# Patient Record
Sex: Female | Born: 1960 | Race: White | Hispanic: No | Marital: Married | Smoking: Never smoker
Health system: Southern US, Community
[De-identification: ages and names within clinical notes are randomized; demographics above are authoritative.]

## PROBLEM LIST (undated history)

## (undated) DIAGNOSIS — I10 Essential (primary) hypertension: Secondary | ICD-10-CM

## (undated) DIAGNOSIS — K219 Gastro-esophageal reflux disease without esophagitis: Secondary | ICD-10-CM

## (undated) DIAGNOSIS — F32A Depression, unspecified: Secondary | ICD-10-CM

## (undated) DIAGNOSIS — R51 Headache: Secondary | ICD-10-CM

## (undated) DIAGNOSIS — G8929 Other chronic pain: Secondary | ICD-10-CM

## (undated) DIAGNOSIS — F329 Major depressive disorder, single episode, unspecified: Secondary | ICD-10-CM

## (undated) DIAGNOSIS — Z90710 Acquired absence of both cervix and uterus: Secondary | ICD-10-CM

## (undated) HISTORY — DX: Headache, unspecified: G89.29

## (undated) HISTORY — DX: Gastro-esophageal reflux disease without esophagitis: K21.9

## (undated) HISTORY — DX: Headache: R51

## (undated) HISTORY — DX: Acquired absence of both cervix and uterus: Z90.710

## (undated) HISTORY — DX: Major depressive disorder, single episode, unspecified: F32.9

## (undated) HISTORY — DX: Depression, unspecified: F32.A

---

## 2010-03-07 HISTORY — PX: LASIK: SHX215

## 2013-07-18 ENCOUNTER — Emergency Department (HOSPITAL_COMMUNITY)
Admission: EM | Admit: 2013-07-18 | Discharge: 2013-07-18 | Disposition: A | Discharge status: Home / Self Care | Payer: BC Managed Care – PPO | Attending: Emergency Medicine | Admitting: Emergency Medicine

## 2013-07-18 ENCOUNTER — Encounter (HOSPITAL_COMMUNITY): Payer: Self-pay | Admitting: Emergency Medicine

## 2013-07-18 ENCOUNTER — Emergency Department (HOSPITAL_COMMUNITY): Payer: BC Managed Care – PPO

## 2013-07-18 DIAGNOSIS — Z79899 Other long term (current) drug therapy: Secondary | ICD-10-CM | POA: Insufficient documentation

## 2013-07-18 DIAGNOSIS — R079 Chest pain, unspecified: Secondary | ICD-10-CM

## 2013-07-18 DIAGNOSIS — I10 Essential (primary) hypertension: Secondary | ICD-10-CM | POA: Insufficient documentation

## 2013-07-18 DIAGNOSIS — R002 Palpitations: Secondary | ICD-10-CM

## 2013-07-18 DIAGNOSIS — R0602 Shortness of breath: Secondary | ICD-10-CM | POA: Insufficient documentation

## 2013-07-18 DIAGNOSIS — R072 Precordial pain: Secondary | ICD-10-CM | POA: Insufficient documentation

## 2013-07-18 DIAGNOSIS — Z7982 Long term (current) use of aspirin: Secondary | ICD-10-CM | POA: Insufficient documentation

## 2013-07-18 HISTORY — DX: Essential (primary) hypertension: I10

## 2013-07-18 LAB — I-STAT TROPONIN, ED
TROPONIN I, POC: 0 ng/mL (ref 0.00–0.08)
Troponin i, poc: 0 ng/mL (ref 0.00–0.08)

## 2013-07-18 LAB — BASIC METABOLIC PANEL WITH GFR
BUN: 13 mg/dL (ref 6–23)
CO2: 24 meq/L (ref 19–32)
Calcium: 9.8 mg/dL (ref 8.4–10.5)
Chloride: 104 meq/L (ref 96–112)
Creatinine, Ser: 0.65 mg/dL (ref 0.50–1.10)
GFR calc Af Amer: >90 mL/min
GFR calc non Af Amer: >90 mL/min
Glucose, Bld: 138 mg/dL — ABNORMAL HIGH (ref 70–99)
Potassium: 3.9 meq/L (ref 3.7–5.3)
Sodium: 141 meq/L (ref 137–147)

## 2013-07-18 LAB — CBC
HCT: 42.7 % (ref 36.0–46.0)
Hemoglobin: 14.8 g/dL (ref 12.0–15.0)
MCH: 26.9 pg (ref 26.0–34.0)
MCHC: 34.7 g/dL (ref 30.0–36.0)
MCV: 77.6 fL — ABNORMAL LOW (ref 78.0–100.0)
Platelets: 253 K/uL (ref 150–400)
RBC: 5.5 MIL/uL — ABNORMAL HIGH (ref 3.87–5.11)
RDW: 16 % — ABNORMAL HIGH (ref 11.5–15.5)
WBC: 4.4 K/uL (ref 4.0–10.5)

## 2013-07-18 LAB — PRO B NATRIURETIC PEPTIDE: Pro B Natriuretic peptide (BNP): 20.9 pg/mL (ref 0–125)

## 2013-07-18 MED ORDER — ASPIRIN 325 MG PO TABS
325.0000 mg | ORAL_TABLET | ORAL | Status: AC
  Administered 2013-07-18: 325 mg via ORAL
  Filled 2013-07-18: qty 1

## 2013-07-18 NOTE — ED Notes (Signed)
Patient transported to X-ray 

## 2013-07-18 NOTE — Discharge Instructions (Signed)
Read instructions below for reasons to return to the Emergency Department. It is recommended that your follow up with your Primary Care Doctor in regards to today's visit. If you do not have a doctor, use the resource guide listed below to help you find one.   Chest Pain (Nonspecific)  HOME CARE INSTRUCTIONS  For the next few days, avoid physical activities that bring on chest pain. Continue physical activities as directed.  Do not smoke cigarettes or drink alcohol until your symptoms are gone.  Only take over-the-counter or prescription medicine for pain, discomfort, or fever as directed by your caregiver.  Follow your caregiver's suggestions for further testing if your chest pain does not go away.  Keep any follow-up appointments you made. If you do not go to an appointment, you could develop lasting (chronic) problems with pain. If there is any problem keeping an appointment, you must call to reschedule.  SEEK MEDICAL CARE IF:  You think you are having problems from the medicine you are taking. Read your medicine instructions carefully.  Your chest pain does not go away, even after treatment.  You develop a rash with blisters on your chest.  SEEK IMMEDIATE MEDICAL CARE IF:  You have increased chest pain or pain that spreads to your arm, neck, jaw, back, or belly (abdomen).  You develop shortness of breath, an increasing cough, or you are coughing up blood.  You have severe back or abdominal pain, feel sick to your stomach (nauseous) or throw up (vomit).  You develop severe weakness, fainting, or chills.  You have an oral temperature above 102 F (38.9 C), not controlled by medicine.   THIS IS AN EMERGENCY. Do not wait to see if the pain will go away. Get medical help at once. Call your local emergency services (911 in [place] Do not drive yourself to the hospital.   RESOURCE GUIDE  Dental Problems  Patients with Medicaid: [facility] [phone] [address] Phone:  [phone]                                                  Phone:  [phone]  If unable to pay or uninsured, contact:  [facility] or [facility]. to become qualified for the adult dental clinic.  Chronic Pain Problems Contact [facility]  [phone] Patients need to be referred by their primary care doctor.  Insufficient Money for Medicine Contact [employer] Way:  call "211" or [place] [phone].  No Primary Care Doctor Call [facility]  [phone] Other agencies that provide inexpensive medical care    [place]  [phone]    [facility]  [place]  [phone]    [facility]  [phone]    [facility]  [place]  [place]  [place]  [place]   [phone] (emergency services [phone])  Substance Abuse Resources Alcohol and Drug Services  [place] [phone] [facility] [place] [phone] Residential & Outpatient Substance Abuse Program  [phone]  Abuse/Neglect [place] [phone] [place] [phone] (After Hours)  Emergency [place] [phone]  [place] [facility] [place] [phone] [place] [phone]  MRSA Hotline #:   (361)714-2010    [facility] Dept. [address]      [place] Phone:  [phone]                                   Phone:  [phone]                 Phone:  [place] Phone:  [place] [phone] (After Hours)

## 2013-07-18 NOTE — ED Provider Notes (Signed)
CSN: [id number]     Arrival date & time 07/18/13  1144 History   First MD Initiated Contact with Patient 07/18/13 1159     Chief Complaint  Patient presents with  . Chest Pain  . Palpitations     (Consider location/radiation/quality/duration/timing/severity/associated sxs/prior Treatment) HPI Comments: Patient presents today with a chief complaint of chest pain, palpitations, and SOB.  She reports that she has been having palpitations intermittently over the past 1.5 weeks.  Palpitations typically last 10-15 minutes and then resolve.  Palpitations have been occuring once a day every other day.  She states that this morning approximately two hours prior to arrival in the ED she began having chest pain.  Pain substernal and did not radiate.  She describes the pain as an ache.  She was sitting and not doing anything exertional at the onset of chest pain.  Pain then became associated with numbness of her left arm en route to the ED.  She reports that she was also feeling mildly short of breath.  She states that all of her symptoms have improved at this time.  She denies any Cardiac history.  She has a history of HTN and is currently on Toprol for that.  Last dose of Toprol was this morning.  She denies Hyperlipidemia or DM.  Denies any family history of cardiac disease.  Denies any history of PE or DVT.  Denies LE edema.  Denies use of estrogen containing medications.  Denies smoking.  Denies prolonged travel or surgeries in the past 4 weeks.  The history is provided by the patient.    Past Medical History  Diagnosis Date  . Hypertension    History reviewed. No pertinent past surgical history. No family history on file. History  Substance Use Topics  . Smoking status: Never Smoker   . Smokeless tobacco: Not on file  . Alcohol Use: No   OB History   Grav Para Term Preterm Abortions TAB SAB Ect Mult Living                 Review of Systems  Respiratory: Positive for shortness of breath.    Cardiovascular: Positive for chest pain and palpitations.  Neurological: Positive for numbness.  All other systems reviewed and are negative.     Allergies  Review of patient's allergies indicates no known allergies.  Home Medications   Prior to Admission medications   Medication Sig Start Date End Date Taking? Authorizing Provider  aspirin-acetaminophen-caffeine (EXCEDRIN MIGRAINE) [phone] MG per tablet Take 1 tablet by mouth daily as needed for headache or migraine.   Yes Historical Provider, MD  escitalopram (LEXAPRO) 10 MG tablet Take 10 mg by mouth daily.   Yes Historical Provider, MD  metoprolol succinate (TOPROL-XL) 25 MG 24 hr tablet Take 25 mg by mouth 2 (two) times daily.   Yes Historical Provider, MD  Multiple Vitamins-Minerals (MULTIVITAMIN PO) Take 1 tablet by mouth daily.   Yes Historical Provider, MD  omeprazole (PRILOSEC) 40 MG capsule Take 40 mg by mouth 2 (two) times daily.   Yes Historical Provider, MD   BP 159/96  Pulse 95  Temp(Src) 97.9 F (36.6 C) (Oral)  Resp 18  SpO2 97% Physical Exam  Nursing note and vitals reviewed. Constitutional: She appears well-developed and well-nourished.  HENT:  Head: Normocephalic and atraumatic.  Mouth/Throat: Oropharynx is clear and moist.  Neck: Normal range of motion. Neck supple.  Cardiovascular: Normal rate, regular rhythm, normal heart sounds and intact distal pulses.  Pulmonary/Chest: Effort normal and breath sounds normal.  Abdominal: Soft. There is no tenderness.  Musculoskeletal: Normal range of motion.  No LE edema bilaterally   Neurological: She is alert.  Skin: Skin is warm and dry.  Psychiatric: She has a normal mood and affect.    ED Course  Procedures (including critical care time) Labs Review Labs Reviewed  CBC - Abnormal; Notable for the following:    RBC 5.50 (*)    MCV 77.6 (*)    RDW 16.0 (*)    All other components within normal limits  BASIC METABOLIC PANEL  PRO B NATRIURETIC  PEPTIDE  I-STAT TROPOININ, ED    Imaging Review Dg Chest 2 View  07/18/2013   CLINICAL DATA:  Sacral ala or history of left midchest discomfort and left upper extremity discomfort.  EXAM: CHEST  2 VIEW  COMPARISON:  None.  FINDINGS: The lungs are adequately inflated. There is no focal infiltrate. There is minimal linear density approximately 3 cm above the left lateral costophrenic gutter on the frontal film which likely reflects scarring. The cardiopericardial silhouette is normal in size. The pulmonary vascularity is not engorged. The mediastinum is normal in width. There is no pleural effusion or pneumothorax or pneumomediastinum. The observed portions of the bony thorax exhibit no acute abnormalities. There is mild degenerative disc space narrowing at multiple levels.  IMPRESSION: There is no evidence of active cardiopulmonary disease.   Electronically Signed   By: David  Martinique   On: 07/18/2013 13:48     EKG Interpretation None      Date: 07/18/2013  Rate: 106  Rhythm: sinus tachycardia  QRS Axis: normal  Intervals: normal  ST/T Wave abnormalities: normal  Conduction Disutrbances:none  Narrative Interpretation:   Old EKG Reviewed: none available    Patient discussed with Dr. Jeneen Rinks. MDM   Final diagnoses:  None   Patient is to be discharged with recommendation to follow up with PCP in regards to today's hospital visit. Patient also given referral to Cardiology.  Chest pain is not likely of cardiac or pulmonary etiology d/t presentation, VSS, no tracheal deviation, no JVD or new murmur, Heart RRR, breath sounds equal bilaterally, EKG without acute abnormalities, negative initial and 3 hour troponin, and negative CXR.  Patient with atypical pain and a heart score of 2.  Patient stable for discharge.  Strict return precautions given.  Case has been discussed with  Dr. Jeneen Rinks who agrees with the above plan to discharge.        Hyman Bible, PA-C 07/18/13 [phone]

## 2013-07-18 NOTE — ED Notes (Signed)
Pt reports intermittent palpitations x 2 weeks, today pt was having some palpitations when she had a sudden onset of CP and numbness in left arm starting at elbow and going down, along with sob and feeling like her heart is fluttering. Pt is non-diaphoretic. VSS. Alert and oriented X 4. Only cardiac hx is HTN. Pt just moved her from [place] and doesn't have a PCP in town yet.

## 2013-07-25 NOTE — ED Provider Notes (Addendum)
Medical screening examination/treatment/procedure(s) were performed by non-physician practitioner and as supervising physician I was immediately available for consultation/collaboration.   EKG Interpretation   Date/Time:  Thursday Jul 18 2013 11:45:20 EDT Ventricular Rate:  106 PR Interval:  162 QRS Duration: 84 QT Interval:  340 QTC Calculation: 451 R Axis:   63 Text Interpretation:  Sinus tachycardia Otherwise normal ECG ED PHYSICIAN  INTERPRETATION AVAILABLE IN [facility] Confirmed by TEST, Record  ([id number]) on 07/20/2013 10:02:38 AM         EKG Interpretation   Date/Time:  Thursday Jul 18 2013 11:45:20 EDT Ventricular Rate:  106 PR Interval:  162 QRS Duration: 84 QT Interval:  340 QTC Calculation: 451 R Axis:   63 Text Interpretation:  Sinus tachycardia Otherwise normal ECG ED PHYSICIAN  INTERPRETATION AVAILABLE IN [facility] Confirmed by TEST, Record  ([id number]) on 07/20/2013 10:02:38 AM        Tanna Furry, MD 07/25/13 7824  Tanna Furry, MD 08/01/13 1601

## 2015-09-07 DIAGNOSIS — Z1211 Encounter for screening for malignant neoplasm of colon: Secondary | ICD-10-CM | POA: Diagnosis not present

## 2015-09-07 DIAGNOSIS — K219 Gastro-esophageal reflux disease without esophagitis: Secondary | ICD-10-CM | POA: Diagnosis not present

## 2015-09-07 DIAGNOSIS — Z01818 Encounter for other preprocedural examination: Secondary | ICD-10-CM | POA: Diagnosis not present

## 2015-10-23 DIAGNOSIS — Z1211 Encounter for screening for malignant neoplasm of colon: Secondary | ICD-10-CM | POA: Diagnosis not present

## 2015-10-23 DIAGNOSIS — K449 Diaphragmatic hernia without obstruction or gangrene: Secondary | ICD-10-CM | POA: Diagnosis not present

## 2015-12-09 DIAGNOSIS — K219 Gastro-esophageal reflux disease without esophagitis: Secondary | ICD-10-CM | POA: Diagnosis not present

## 2016-04-20 ENCOUNTER — Other Ambulatory Visit: Payer: Self-pay | Admitting: Physician Assistant

## 2016-04-20 ENCOUNTER — Ambulatory Visit
Admission: RE | Admit: 2016-04-20 | Discharge: 2016-04-20 | Disposition: A | Discharge status: Home / Self Care | Payer: BLUE CROSS/BLUE SHIELD | Source: Ambulatory Visit | Attending: Physician Assistant | Admitting: Physician Assistant

## 2016-04-20 DIAGNOSIS — R05 Cough: Secondary | ICD-10-CM | POA: Diagnosis not present

## 2016-04-20 DIAGNOSIS — K219 Gastro-esophageal reflux disease without esophagitis: Secondary | ICD-10-CM | POA: Diagnosis not present

## 2016-04-20 DIAGNOSIS — R0602 Shortness of breath: Secondary | ICD-10-CM | POA: Diagnosis not present

## 2016-04-20 DIAGNOSIS — R079 Chest pain, unspecified: Secondary | ICD-10-CM | POA: Diagnosis not present

## 2016-05-04 DIAGNOSIS — Z124 Encounter for screening for malignant neoplasm of cervix: Secondary | ICD-10-CM | POA: Diagnosis not present

## 2016-05-04 DIAGNOSIS — Z1151 Encounter for screening for human papillomavirus (HPV): Secondary | ICD-10-CM | POA: Diagnosis not present

## 2016-05-04 DIAGNOSIS — Z1231 Encounter for screening mammogram for malignant neoplasm of breast: Secondary | ICD-10-CM | POA: Diagnosis not present

## 2016-05-04 DIAGNOSIS — Z130 Encounter for screening for diseases of the blood and blood-forming organs and certain disorders involving the immune mechanism: Secondary | ICD-10-CM | POA: Diagnosis not present

## 2016-05-04 DIAGNOSIS — Z01419 Encounter for gynecological examination (general) (routine) without abnormal findings: Secondary | ICD-10-CM | POA: Diagnosis not present

## 2016-05-04 DIAGNOSIS — Z1389 Encounter for screening for other disorder: Secondary | ICD-10-CM | POA: Diagnosis not present

## 2016-05-04 DIAGNOSIS — Z6824 Body mass index (BMI) 24.0-24.9, adult: Secondary | ICD-10-CM | POA: Diagnosis not present

## 2016-05-05 DIAGNOSIS — Z124 Encounter for screening for malignant neoplasm of cervix: Secondary | ICD-10-CM | POA: Diagnosis not present

## 2016-05-13 ENCOUNTER — Other Ambulatory Visit: Payer: Self-pay | Admitting: Obstetrics and Gynecology

## 2016-05-13 DIAGNOSIS — N6320 Unspecified lump in the left breast, unspecified quadrant: Secondary | ICD-10-CM

## 2016-05-18 ENCOUNTER — Ambulatory Visit
Admission: RE | Admit: 2016-05-18 | Discharge: 2016-05-18 | Disposition: A | Discharge status: Home / Self Care | Payer: BLUE CROSS/BLUE SHIELD | Source: Ambulatory Visit | Attending: Obstetrics and Gynecology | Admitting: Obstetrics and Gynecology

## 2016-05-18 ENCOUNTER — Other Ambulatory Visit: Payer: BLUE CROSS/BLUE SHIELD

## 2016-05-18 DIAGNOSIS — R928 Other abnormal and inconclusive findings on diagnostic imaging of breast: Secondary | ICD-10-CM | POA: Diagnosis not present

## 2016-05-18 DIAGNOSIS — K219 Gastro-esophageal reflux disease without esophagitis: Secondary | ICD-10-CM | POA: Diagnosis not present

## 2016-05-18 DIAGNOSIS — N6489 Other specified disorders of breast: Secondary | ICD-10-CM | POA: Diagnosis not present

## 2016-05-18 DIAGNOSIS — N6320 Unspecified lump in the left breast, unspecified quadrant: Secondary | ICD-10-CM

## 2016-05-18 LAB — HM MAMMOGRAPHY: HM MAMMO: ABNORMAL — AB (ref 0–4)

## 2016-08-29 DIAGNOSIS — D179 Benign lipomatous neoplasm, unspecified: Secondary | ICD-10-CM | POA: Diagnosis not present

## 2016-08-29 DIAGNOSIS — E7800 Pure hypercholesterolemia, unspecified: Secondary | ICD-10-CM | POA: Diagnosis not present

## 2016-08-29 DIAGNOSIS — R7301 Impaired fasting glucose: Secondary | ICD-10-CM | POA: Diagnosis not present

## 2016-08-29 DIAGNOSIS — K219 Gastro-esophageal reflux disease without esophagitis: Secondary | ICD-10-CM | POA: Diagnosis not present

## 2016-08-29 DIAGNOSIS — I10 Essential (primary) hypertension: Secondary | ICD-10-CM | POA: Diagnosis not present

## 2016-08-29 LAB — BASIC METABOLIC PANEL
BUN: 19 (ref 4–21)
Creatinine: 0.8 (ref 0.5–1.1)
Glucose: 100

## 2016-08-29 LAB — CBC AND DIFFERENTIAL: WBC: 5.7

## 2016-12-26 ENCOUNTER — Ambulatory Visit: Payer: BLUE CROSS/BLUE SHIELD | Admitting: Family Medicine

## 2016-12-28 ENCOUNTER — Encounter: Payer: Self-pay | Admitting: Family Medicine

## 2016-12-28 ENCOUNTER — Ambulatory Visit (INDEPENDENT_AMBULATORY_CARE_PROVIDER_SITE_OTHER): Payer: BLUE CROSS/BLUE SHIELD | Admitting: Family Medicine

## 2016-12-28 VITALS — BP 150/100 | HR 82 | Ht 59.5 in | Wt 129.0 lb

## 2016-12-28 DIAGNOSIS — Z1159 Encounter for screening for other viral diseases: Secondary | ICD-10-CM | POA: Diagnosis not present

## 2016-12-28 DIAGNOSIS — I10 Essential (primary) hypertension: Secondary | ICD-10-CM | POA: Diagnosis not present

## 2016-12-28 DIAGNOSIS — Z13220 Encounter for screening for lipoid disorders: Secondary | ICD-10-CM | POA: Diagnosis not present

## 2016-12-28 DIAGNOSIS — F325 Major depressive disorder, single episode, in full remission: Secondary | ICD-10-CM

## 2016-12-28 DIAGNOSIS — R739 Hyperglycemia, unspecified: Secondary | ICD-10-CM | POA: Diagnosis not present

## 2016-12-28 LAB — COMPREHENSIVE METABOLIC PANEL
ALBUMIN: 4.3 g/dL (ref 3.5–5.2)
ALT: 37 U/L — ABNORMAL HIGH (ref 0–35)
AST: 23 U/L (ref 0–37)
Alkaline Phosphatase: 59 U/L (ref 39–117)
BILIRUBIN TOTAL: 0.6 mg/dL (ref 0.2–1.2)
BUN: 17 mg/dL (ref 6–23)
CO2: 27 meq/L (ref 19–32)
Calcium: 9.7 mg/dL (ref 8.4–10.5)
Chloride: 105 mEq/L (ref 96–112)
Creatinine, Ser: 0.77 mg/dL (ref 0.40–1.20)
GFR: 82.04 mL/min (ref >60.00)
Glucose, Bld: 114 mg/dL — ABNORMAL HIGH (ref 70–99)
Potassium: 4.1 mEq/L (ref 3.5–5.1)
Sodium: 140 mEq/L (ref 135–145)
Total Protein: 7.1 g/dL (ref 6.0–8.3)

## 2016-12-28 LAB — CBC
HCT: 47.5 % — ABNORMAL HIGH (ref 36.0–46.0)
Hemoglobin: 15.9 g/dL — ABNORMAL HIGH (ref 12.0–15.0)
MCHC: 33.5 g/dL (ref 30.0–36.0)
MCV: 84.3 fl (ref 78.0–100.0)
PLATELETS: 269 10*3/uL (ref 150.0–400.0)
RBC: 5.63 Mil/uL — ABNORMAL HIGH (ref 3.87–5.11)
RDW: 14 % (ref 11.5–15.5)
WBC: 5.1 10*3/uL (ref 4.0–10.5)

## 2016-12-28 LAB — LIPID PANEL
Cholesterol: 238 mg/dL — ABNORMAL HIGH (ref 0–200)
HDL: 50.6 mg/dL (ref >39.00)
NonHDL: 186.96
TRIGLYCERIDES: 287 mg/dL — AB (ref 0.0–149.0)
Total CHOL/HDL Ratio: 5
VLDL: 57.4 mg/dL — ABNORMAL HIGH (ref 0.0–40.0)

## 2016-12-28 LAB — TSH: TSH: 1.06 u[IU]/mL (ref 0.35–4.50)

## 2016-12-28 LAB — HEMOGLOBIN A1C: HEMOGLOBIN A1C: 6.1 % (ref 4.6–6.5)

## 2016-12-28 LAB — LDL CHOLESTEROL, DIRECT: LDL DIRECT: 160 mg/dL

## 2016-12-28 NOTE — Progress Notes (Signed)
Subjective:  Debbie Hill is a 56 y.o. female who presents today with a chief complaint of HTN and to establish care.   HPI:  Hypertension, Chronic Problem, new to this provider BP Readings from Last 3 Encounters:  12/28/16 (!) 150/100  07/18/13 145/96  Home BP monitoring: None Current Medications: Metoprolol 25 mg twice daily, compliant without side effects. Additional history: Long-standing history per patient.  Has been on metoprolol for several years.  She has also been on another blood pressure agent in the past but she does not remember the name.  Does not regularly exercise.  ROS: Denies any chest pain, shortness of breath, dyspnea on exertion, leg edema.   Depression/Anxiety, Chronic, new to this provider Current Medications: Lexapro 10 mg daily Side Effects: None Current Symptoms/Interim History: Long-standing history.  Has been stable on Lexapro for several years.  Depression screen [facility] 2/9 12/28/2016  Decreased Interest 0  Down, Depressed, Hopeless 0  PHQ - 2 Score 0   ROS: No SI or HI.  Clumsiness Patient is also concerned that she has been running into doors and walls.  No weakness, numbness, or other neurological symptoms.  No vision changes.  ROS: Per HPI, otherwise a 14 point review of systems was performed and was negative  PMH:  The following were reviewed and entered/updated in epic: Past Medical History:  Diagnosis Date  . Chronic headaches   . Depression   . GERD (gastroesophageal reflux disease)   . Hypertension    Patient Active Problem List   Diagnosis Date Noted  . Hypertension 12/28/2016  . Depression, major, single episode, complete remission ([place]) 12/28/2016   History reviewed. No pertinent surgical history.  Family History  Problem Relation Age of Onset  . Cancer Mother        Lung Cancer  . Hypertension Mother   . Cancer Father        Lung Cancer  . Hypertension Sister   . Depression Sister   . Hypertension Paternal  Grandmother   . Cancer Paternal Grandfather        Esophagus Cancer    Medications- reviewed and updated Current Outpatient Prescriptions  Medication Sig Dispense Refill  . aspirin-acetaminophen-caffeine (EXCEDRIN MIGRAINE) 250-250-65 MG per tablet Take 1 tablet by mouth daily as needed for headache or migraine.    Marland Kitchen escitalopram (LEXAPRO) 10 MG tablet Take 10 mg by mouth daily.    . metoprolol succinate (TOPROL-XL) 25 MG 24 hr tablet Take 25 mg by mouth 2 (two) times daily.    . Multiple Vitamins-Minerals (MULTIVITAMIN PO) Take 1 tablet by mouth daily.    . pantoprazole (PROTONIX) 40 MG tablet pantoprazole 40 mg tablet,delayed release    . ranitidine (ZANTAC) 300 MG tablet ranitidine 300 mg tablet     No current facility-administered medications for this visit.     Allergies-reviewed and updated No Known Allergies  Social History   Social History  . Marital status: Married    Spouse name: N/A  . Number of children: 2  . Years of education: N/A   Social History Main Topics  . Smoking status: Never Smoker  . Smokeless tobacco: Never Used  . Alcohol use No  . Drug use: No  . Sexual activity: Yes    Partners: Male   Other Topics Concern  . None   Social History Narrative  . None   Objective:  Physical Exam: BP (!) 150/100 (BP Location: Right Arm, Cuff Size: Normal)   Pulse 82  Ht 4' 11.5" (1.511 m)   Wt 129 lb (58.5 kg)   BMI 25.62 kg/m   Gen: NAD, resting comfortably HEENT: Extraocular eye movements intact.  Pupils equally round and reactive to light. CV: RRR with no murmurs appreciated Pulm: NWOB, CTAB with no crackles, wheezes, or rhonchi GI: Normal bowel sounds present. Soft, Nontender, Nondistended. MSK: No edema, cyanosis, or clubbing noted Skin: Warm, dry Neuro: Cranial nerves II through XII intact.  Finger-nose-finger testing intact bilaterally.  Strength 5 out of 5 in upper and lower extremities.  Sensation to light touch intact throughout.  Reflexes  2+ and symmetric bilaterally in upper and lower extremities. Psych: Normal affect and thought content  Assessment/Plan:  Hypertension Slightly above goal today.  Discussed adding on additional antihypertensive, however patient deferred.  Advised regular exercise and a healthy, low-salt diet.  Patient will follow-up in 3-4 weeks for blood pressure check.  If persistently elevated above 140/90, would start additional agent.  Depression, major, single episode, complete remission (HCC) PHQ score of 0 today.  Continue Lexapro 10 mg daily.  Preventative healthcare Flu shot deferred today.  Check lipid panel.  Check hepatitis C antibody.  Up-to-date on colonoscopy, Pap, mammogram- will obtain these records.  Hyperglycemia Check A1c today.  Algis Greenhouse. [name], MD 12/28/2016 9:53 AM

## 2016-12-28 NOTE — Assessment & Plan Note (Signed)
Slightly above goal today.  Discussed adding on additional antihypertensive, however patient deferred.  Advised regular exercise and a healthy, low-salt diet.  Patient will follow-up in 3-4 weeks for blood pressure check.  If persistently elevated above 140/90, would start additional agent.

## 2016-12-28 NOTE — Assessment & Plan Note (Signed)
PHQ score of 0 today.  Continue Lexapro 10 mg daily.

## 2016-12-28 NOTE — Patient Instructions (Addendum)
We will check blood work today.  Continue working on diet and exercise.  Come back for a blood pressure check in 3-4 weeks.  Your goal blood pressure is 140/90 or lower.  Take care, Dr. [name]   Preventive Care 40-64 Years, Female Preventive care refers to lifestyle choices and visits with your health care provider that can promote health and wellness. What does preventive care include?  A yearly physical exam. This is also called an annual well check.  Dental exams once or twice a year.  Routine eye exams. Ask your health care provider how often you should have your eyes checked.  Personal lifestyle choices, including: ? Daily care of your teeth and gums. ? Regular physical activity. ? Eating a healthy diet. ? Avoiding tobacco and drug use. ? Limiting alcohol use. ? Practicing safe sex. ? Taking low-dose aspirin daily starting at age 62. ? Taking vitamin and mineral supplements as recommended by your health care provider. What happens during an annual well check? The services and screenings done by your health care provider during your annual well check will depend on your age, overall health, lifestyle risk factors, and family history of disease. Counseling Your health care provider may ask you questions about your:  Alcohol use.  Tobacco use.  Drug use.  Emotional well-being.  Home and relationship well-being.  Sexual activity.  Eating habits.  Work and work Astronomer.  Method of birth control.  Menstrual cycle.  Pregnancy history.  Screening You may have the following tests or measurements:  Height, weight, and BMI.  Blood pressure.  Lipid and cholesterol levels. These may be checked every 5 years, or more frequently if you are over 63 years old.  Skin check.  Lung cancer screening. You may have this screening every year starting at age 36 if you have a 30-pack-year history of smoking and currently smoke or have quit within the past 15  years.  Fecal occult blood test (FOBT) of the stool. You may have this test every year starting at age 67.  Flexible sigmoidoscopy or colonoscopy. You may have a sigmoidoscopy every 5 years or a colonoscopy every 10 years starting at age 23.  Hepatitis C blood test.  Hepatitis B blood test.  Sexually transmitted disease (STD) testing.  Diabetes screening. This is done by checking your blood sugar (glucose) after you have not eaten for a while (fasting). You may have this done every 1-3 years.  Mammogram. This may be done every 1-2 years. Talk to your health care provider about when you should start having regular mammograms. This may depend on whether you have a family history of breast cancer.  BRCA-related cancer screening. This may be done if you have a family history of breast, ovarian, tubal, or peritoneal cancers.  Pelvic exam and Pap test. This may be done every 3 years starting at age 20. Starting at age 34, this may be done every 5 years if you have a Pap test in combination with an HPV test.  Bone density scan. This is done to screen for osteoporosis. You may have this scan if you are at high risk for osteoporosis.  Discuss your test results, treatment options, and if necessary, the need for more tests with your health care provider. Vaccines Your health care provider may recommend certain vaccines, such as:  Influenza vaccine. This is recommended every year.  Tetanus, diphtheria, and acellular pertussis (Tdap, Td) vaccine. You may need a Td booster every 10 years.  Varicella vaccine. You  may need this if you have not been vaccinated.  Zoster vaccine. You may need this after age 42.  Measles, mumps, and rubella (MMR) vaccine. You may need at least one dose of MMR if you were born in 1957 or later. You may also need a second dose.  Pneumococcal 13-valent conjugate (PCV13) vaccine. You may need this if you have certain conditions and were not previously  vaccinated.  Pneumococcal polysaccharide (PPSV23) vaccine. You may need one or two doses if you smoke cigarettes or if you have certain conditions.  Meningococcal vaccine. You may need this if you have certain conditions.  Hepatitis A vaccine. You may need this if you have certain conditions or if you travel or work in places where you may be exposed to hepatitis A.  Hepatitis B vaccine. You may need this if you have certain conditions or if you travel or work in places where you may be exposed to hepatitis B.  Haemophilus influenzae type b (Hib) vaccine. You may need this if you have certain conditions.  Talk to your health care provider about which screenings and vaccines you need and how often you need them. This information is not intended to replace advice given to you by your health care provider. Make sure you discuss any questions you have with your health care provider. Document Released: 03/20/2015 Document Revised: 11/11/2015 Document Reviewed: 12/23/2014 Elsevier Interactive Patient Education  2017 [employer].

## 2016-12-29 LAB — HEPATITIS C ANTIBODY
Hepatitis C Ab: NONREACTIVE
SIGNAL TO CUT-OFF: 0.01 (ref <1.00)

## 2017-01-03 ENCOUNTER — Other Ambulatory Visit: Payer: Self-pay

## 2017-01-03 MED ORDER — METOPROLOL SUCCINATE ER 25 MG PO TB24: 25.0000 mg | ORAL_TABLET | Freq: Two times a day (BID) | ORAL | 1 refills | Status: DC

## 2017-01-03 MED ORDER — ESCITALOPRAM OXALATE 10 MG PO TABS: 10.0000 mg | ORAL_TABLET | Freq: Every day | ORAL | 1 refills | Status: DC

## 2017-01-03 MED ORDER — ESCITALOPRAM OXALATE 10 MG PO TABS
10.0000 mg | ORAL_TABLET | Freq: Every day | ORAL | 1 refills | Status: DC
Start: 2017-01-03 — End: 2017-01-12

## 2017-01-09 ENCOUNTER — Other Ambulatory Visit: Payer: Self-pay

## 2017-01-09 MED ORDER — METOPROLOL SUCCINATE ER 25 MG PO TB24: 25.0000 mg | ORAL_TABLET | Freq: Two times a day (BID) | ORAL | 1 refills | Status: DC

## 2017-01-12 ENCOUNTER — Other Ambulatory Visit: Payer: Self-pay

## 2017-01-12 MED ORDER — ESCITALOPRAM OXALATE 10 MG PO TABS: 10.0000 mg | ORAL_TABLET | Freq: Every day | ORAL | 1 refills | Status: DC

## 2017-01-13 ENCOUNTER — Other Ambulatory Visit: Payer: Self-pay | Admitting: Physician Assistant

## 2017-01-13 DIAGNOSIS — K219 Gastro-esophageal reflux disease without esophagitis: Secondary | ICD-10-CM | POA: Diagnosis not present

## 2017-01-13 DIAGNOSIS — R1314 Dysphagia, pharyngoesophageal phase: Secondary | ICD-10-CM

## 2017-01-13 DIAGNOSIS — Z79899 Other long term (current) drug therapy: Secondary | ICD-10-CM | POA: Diagnosis not present

## 2017-01-13 DIAGNOSIS — K449 Diaphragmatic hernia without obstruction or gangrene: Secondary | ICD-10-CM

## 2017-01-13 LAB — VITAMIN B12: Vitamin B-12: 583

## 2017-01-13 LAB — VITAMIN D 25 HYDROXY (VIT D DEFICIENCY, FRACTURES): Vit D, 25-Hydroxy: 22.5

## 2017-01-17 ENCOUNTER — Other Ambulatory Visit: Payer: BLUE CROSS/BLUE SHIELD

## 2017-01-18 ENCOUNTER — Ambulatory Visit (INDEPENDENT_AMBULATORY_CARE_PROVIDER_SITE_OTHER): Payer: BLUE CROSS/BLUE SHIELD | Admitting: Family Medicine

## 2017-01-18 ENCOUNTER — Encounter: Payer: Self-pay | Admitting: Family Medicine

## 2017-01-18 VITALS — BP 130/100 | HR 86 | Ht 59.5 in | Wt 131.4 lb

## 2017-01-18 DIAGNOSIS — I10 Essential (primary) hypertension: Secondary | ICD-10-CM

## 2017-01-18 DIAGNOSIS — R7303 Prediabetes: Secondary | ICD-10-CM | POA: Diagnosis not present

## 2017-01-18 DIAGNOSIS — E785 Hyperlipidemia, unspecified: Secondary | ICD-10-CM

## 2017-01-18 MED ORDER — ESCITALOPRAM OXALATE 10 MG PO TABS: 10.0000 mg | ORAL_TABLET | Freq: Every day | ORAL | 1 refills | Status: DC

## 2017-01-18 MED ORDER — METOPROLOL SUCCINATE ER 25 MG PO TB24: 25.0000 mg | ORAL_TABLET | Freq: Two times a day (BID) | ORAL | 1 refills | Status: DC

## 2017-01-18 NOTE — Addendum Note (Signed)
Addended by: Elio Forget on: 01/18/2017 10:22 AM   Modules accepted: Orders

## 2017-01-18 NOTE — Assessment & Plan Note (Signed)
Slightly above goal today.  Patient reports pressures in the 120s over 80s previously.  Discussed treatment options with patient.  Deferred starting additional medication today.  Discussed lifestyle modifications including regular exercise, healthy/low salt diet.  Advised patient to continue checking blood pressures at home for the next couple of months.  Blood pressure log was given to patient.  Follow-up in 2-3 months.  If pressures persistently above 140/90 would consider additional agent.  Patient will bring blood pressure log for next visit.

## 2017-01-18 NOTE — Progress Notes (Signed)
    Subjective:  Debbie Hill is a 56 y.o. female who presents today with a chief complaint of HTN follow up.   HPI:  Hypertension, Chronic Problem, Stable BP Readings from Last 3 Encounters:  01/18/17 (!) 130/100  12/28/16 (!) 150/100  07/18/13 145/96   Home BP monitoring: 120s/80s Current Medications: Metoprolol 25 mg twice daily, compliant without side effects.  ROS: Denies any chest pain, shortness of breath, dyspnea on exertion, leg edema.   Hyperlipidemia, New Problem Found on screening blood work last week. Not on any medications currently.   ROS: No chest pain or shortness of breath. No myalgias.  Prediabetes, New Problem A1c elevated to 6.1 on blood work last week. Not currently on any medications.  ROS: Per HPI  PMH: Smoking history reviewed.  Never smoker.  Objective:  Physical Exam: BP (!) 130/100   Pulse 86   Ht 4' 11.5" (1.511 m)   Wt 131 lb 6.4 oz (59.6 kg)   SpO2 98%   BMI 26.10 kg/m   Gen: NAD, resting comfortably CV: RRR with no murmurs appreciated Pulm: NWOB, CTAB with no crackles, wheezes, or rhonchi MSK: No edema, cyanosis, or clubbing noted Skin: Warm, dry Neuro: Grossly normal, moves all extremities Psych: Normal affect and thought content  Assessment/Plan:  Hypertension Slightly above goal today.  Patient reports pressures in the 120s over 80s previously.  Discussed treatment options with patient.  Deferred starting additional medication today.  Discussed lifestyle modifications including regular exercise, healthy/low salt diet.  Advised patient to continue checking blood pressures at home for the next couple of months.  Blood pressure log was given to patient.  Follow-up in 2-3 months.  If pressures persistently above 140/90 would consider additional agent.  Patient will bring blood pressure log for next visit.  Dyslipidemia Discussed treatment options with patient.  Defer starting medication today.  Encouraged healthy diet and regular  exercise.  Repeat lipid panel in 6-12 months.  Prediabetes A1c 6.1%.  Discussed treatment options with patient today.  Defer starting metformin.  Encouraged lifestyle modifications including healthy diet and regular exercise.  Repeat A1c in 6-12 months.   Algis Greenhouse. [name], MD 01/18/2017 8:33 AM

## 2017-01-18 NOTE — Assessment & Plan Note (Signed)
Discussed treatment options with patient.  Defer starting medication today.  Encouraged healthy diet and regular exercise.  Repeat lipid panel in 6-12 months.

## 2017-01-18 NOTE — Assessment & Plan Note (Signed)
A1c 6.1%.  Discussed treatment options with patient today.  Defer starting metformin.  Encouraged lifestyle modifications including healthy diet and regular exercise.  Repeat A1c in 6-12 months.

## 2017-01-20 ENCOUNTER — Ambulatory Visit
Admission: RE | Admit: 2017-01-20 | Discharge: 2017-01-20 | Disposition: A | Discharge status: Home / Self Care | Payer: BLUE CROSS/BLUE SHIELD | Source: Ambulatory Visit | Attending: Physician Assistant | Admitting: Physician Assistant

## 2017-01-20 DIAGNOSIS — K449 Diaphragmatic hernia without obstruction or gangrene: Secondary | ICD-10-CM

## 2017-01-20 DIAGNOSIS — K219 Gastro-esophageal reflux disease without esophagitis: Secondary | ICD-10-CM

## 2017-01-20 DIAGNOSIS — R1310 Dysphagia, unspecified: Secondary | ICD-10-CM | POA: Diagnosis not present

## 2017-01-20 DIAGNOSIS — R1314 Dysphagia, pharyngoesophageal phase: Secondary | ICD-10-CM

## 2017-02-07 ENCOUNTER — Other Ambulatory Visit: Payer: Self-pay | Admitting: Obstetrics and Gynecology

## 2017-02-07 DIAGNOSIS — N6320 Unspecified lump in the left breast, unspecified quadrant: Secondary | ICD-10-CM

## 2017-02-09 ENCOUNTER — Encounter: Payer: Self-pay | Admitting: Family Medicine

## 2017-02-17 ENCOUNTER — Encounter: Payer: Self-pay | Admitting: Physical Therapy

## 2017-03-16 ENCOUNTER — Ambulatory Visit
Admission: RE | Admit: 2017-03-16 | Discharge: 2017-03-16 | Disposition: A | Discharge status: Home / Self Care | Payer: BLUE CROSS/BLUE SHIELD | Source: Ambulatory Visit | Attending: Obstetrics and Gynecology | Admitting: Obstetrics and Gynecology

## 2017-03-16 ENCOUNTER — Ambulatory Visit: Payer: BLUE CROSS/BLUE SHIELD | Admitting: Family Medicine

## 2017-03-16 ENCOUNTER — Other Ambulatory Visit: Payer: Self-pay | Admitting: Obstetrics and Gynecology

## 2017-03-16 DIAGNOSIS — N6320 Unspecified lump in the left breast, unspecified quadrant: Secondary | ICD-10-CM

## 2017-03-16 DIAGNOSIS — N6489 Other specified disorders of breast: Secondary | ICD-10-CM | POA: Diagnosis not present

## 2017-03-16 DIAGNOSIS — R928 Other abnormal and inconclusive findings on diagnostic imaging of breast: Secondary | ICD-10-CM | POA: Diagnosis not present

## 2017-05-15 ENCOUNTER — Ambulatory Visit
Admission: RE | Admit: 2017-05-15 | Discharge: 2017-05-15 | Disposition: A | Discharge status: Home / Self Care | Payer: BLUE CROSS/BLUE SHIELD | Source: Ambulatory Visit | Attending: Obstetrics and Gynecology | Admitting: Obstetrics and Gynecology

## 2017-05-15 DIAGNOSIS — N6320 Unspecified lump in the left breast, unspecified quadrant: Secondary | ICD-10-CM

## 2017-05-15 DIAGNOSIS — R928 Other abnormal and inconclusive findings on diagnostic imaging of breast: Secondary | ICD-10-CM | POA: Diagnosis not present

## 2017-06-28 DIAGNOSIS — Z1389 Encounter for screening for other disorder: Secondary | ICD-10-CM | POA: Diagnosis not present

## 2017-06-28 DIAGNOSIS — Z6825 Body mass index (BMI) 25.0-25.9, adult: Secondary | ICD-10-CM | POA: Diagnosis not present

## 2017-06-28 DIAGNOSIS — Z130 Encounter for screening for diseases of the blood and blood-forming organs and certain disorders involving the immune mechanism: Secondary | ICD-10-CM | POA: Diagnosis not present

## 2017-06-28 DIAGNOSIS — Z01419 Encounter for gynecological examination (general) (routine) without abnormal findings: Secondary | ICD-10-CM | POA: Diagnosis not present

## 2017-06-28 LAB — CBC AND DIFFERENTIAL: HEMOGLOBIN: 16.1 — AB (ref 12.0–16.0)

## 2017-07-10 ENCOUNTER — Encounter: Payer: Self-pay | Admitting: Physical Therapy

## 2017-07-26 ENCOUNTER — Other Ambulatory Visit: Payer: Self-pay | Admitting: Family Medicine

## 2017-08-03 ENCOUNTER — Other Ambulatory Visit: Payer: Self-pay

## 2017-08-03 ENCOUNTER — Other Ambulatory Visit: Payer: Self-pay | Admitting: Family Medicine

## 2018-01-22 ENCOUNTER — Other Ambulatory Visit: Payer: Self-pay | Admitting: Family Medicine

## 2018-01-30 ENCOUNTER — Other Ambulatory Visit: Payer: Self-pay | Admitting: Family Medicine

## 2018-03-09 ENCOUNTER — Encounter: Payer: Self-pay | Admitting: Family Medicine

## 2018-03-09 ENCOUNTER — Ambulatory Visit (INDEPENDENT_AMBULATORY_CARE_PROVIDER_SITE_OTHER): Payer: BLUE CROSS/BLUE SHIELD | Admitting: Family Medicine

## 2018-03-09 VITALS — BP 158/92 | HR 68 | Temp 99.1°F | Ht 59.5 in | Wt 135.2 lb

## 2018-03-09 DIAGNOSIS — I10 Essential (primary) hypertension: Secondary | ICD-10-CM | POA: Diagnosis not present

## 2018-03-09 DIAGNOSIS — R079 Chest pain, unspecified: Secondary | ICD-10-CM

## 2018-03-09 DIAGNOSIS — E785 Hyperlipidemia, unspecified: Secondary | ICD-10-CM | POA: Diagnosis not present

## 2018-03-09 DIAGNOSIS — F325 Major depressive disorder, single episode, in full remission: Secondary | ICD-10-CM

## 2018-03-09 DIAGNOSIS — K219 Gastro-esophageal reflux disease without esophagitis: Secondary | ICD-10-CM | POA: Insufficient documentation

## 2018-03-09 DIAGNOSIS — R7303 Prediabetes: Secondary | ICD-10-CM

## 2018-03-09 LAB — COMPREHENSIVE METABOLIC PANEL
ALT: 41 U/L — ABNORMAL HIGH (ref 0–35)
AST: 24 U/L (ref 0–37)
Albumin: 4.2 g/dL (ref 3.5–5.2)
Alkaline Phosphatase: 71 U/L (ref 39–117)
BUN: 18 mg/dL (ref 6–23)
CALCIUM: 9.3 mg/dL (ref 8.4–10.5)
CHLORIDE: 106 meq/L (ref 96–112)
CO2: 26 meq/L (ref 19–32)
CREATININE: 0.7 mg/dL (ref 0.40–1.20)
GFR: 91.19 mL/min (ref >60.00)
Glucose, Bld: 99 mg/dL (ref 70–99)
POTASSIUM: 4.1 meq/L (ref 3.5–5.1)
Sodium: 141 mEq/L (ref 135–145)
Total Bilirubin: 0.6 mg/dL (ref 0.2–1.2)
Total Protein: 6.5 g/dL (ref 6.0–8.3)

## 2018-03-09 LAB — CBC
HEMATOCRIT: 46.3 % — AB (ref 36.0–46.0)
Hemoglobin: 15.7 g/dL — ABNORMAL HIGH (ref 12.0–15.0)
MCHC: 33.9 g/dL (ref 30.0–36.0)
MCV: 83.1 fl (ref 78.0–100.0)
PLATELETS: 264 10*3/uL (ref 150.0–400.0)
RBC: 5.57 Mil/uL — ABNORMAL HIGH (ref 3.87–5.11)
RDW: 13.5 % (ref 11.5–15.5)
WBC: 5.2 10*3/uL (ref 4.0–10.5)

## 2018-03-09 LAB — LIPID PANEL
CHOLESTEROL: 238 mg/dL — AB (ref 0–200)
HDL: 49.8 mg/dL (ref >39.00)
LDL Cholesterol: 158 mg/dL — ABNORMAL HIGH (ref 0–99)
NONHDL: 187.71
Total CHOL/HDL Ratio: 5
Triglycerides: 147 mg/dL (ref 0.0–149.0)
VLDL: 29.4 mg/dL (ref 0.0–40.0)

## 2018-03-09 LAB — HEMOGLOBIN A1C: HEMOGLOBIN A1C: 6 % (ref 4.6–6.5)

## 2018-03-09 LAB — TSH: TSH: 1.1 u[IU]/mL (ref 0.35–4.50)

## 2018-03-09 LAB — VITAMIN B12: Vitamin B-12: 571 pg/mL (ref 211–911)

## 2018-03-09 MED ORDER — ESCITALOPRAM OXALATE 10 MG PO TABS: 10.0000 mg | ORAL_TABLET | Freq: Every day | ORAL | 5 refills | Status: DC

## 2018-03-09 MED ORDER — METOPROLOL SUCCINATE ER 100 MG PO TB24: 100.0000 mg | ORAL_TABLET | Freq: Every day | ORAL | 3 refills | Status: DC

## 2018-03-09 NOTE — Assessment & Plan Note (Addendum)
Will follow up with GI soon. Continue dexilant 60mg  and ranitidine 300mg . Check B12.

## 2018-03-09 NOTE — Patient Instructions (Signed)
It was very nice to see you today!  Please increase your metoprolol to 100 mg daily.  You can double up on your current medications until you get your new prescription.  We will check blood work today to make sure you are cholesterol levels, blood sugar levels, blood counts, thyroid level, and electrolyte levels are okay.  Please follow-up with your GI doctor next week.  Please keep a close eye in your blood pressure and let me know if it is persistently 140/90 or lower.  Come back see me in 3 months, or sooner as needed.  Take care, Dr [name]

## 2018-03-09 NOTE — Assessment & Plan Note (Signed)
Above goal.  We will increase her metoprolol to 100 mg once daily.  Over this should help some with her anxiety symptoms as well.  Advised patient to keep close eye on her blood pressure over the next few weeks and let me know if persistently 140/90 or higher.  Discussed potential adverse reactions.  Follow-up with me in 3 months.

## 2018-03-09 NOTE — Assessment & Plan Note (Signed)
Check A1c today.

## 2018-03-09 NOTE — Assessment & Plan Note (Signed)
PHQ stable however her GAD score is elevated.  Will increase her dose of metoprolol has been above which should hopefully help some with her anxiety symptoms.  She declined increasing dose of Lexapro today.  Will check CBC, CMP, TSH.  Follow-up with me in 3 months.  Discussed reasons to return to care earlier.

## 2018-03-09 NOTE — Assessment & Plan Note (Signed)
Check lipid panel  

## 2018-03-09 NOTE — Progress Notes (Signed)
Subjective:  Debbie Hill is a 58 y.o. female who presents today with a chief complaint of HTN.   HPI:  HTN, chronic problem, uncontrolled Last seen about a year ago for this.  She is currently on metoprolol succinate 25 mg twice daily.  She does not check her blood pressures at home.  She is tolerating her medication without side effect.  She does note that she has had intermittent blurred vision over the last several months.  She thinks that this is due to staring at her screen at work for too long.  No weakness or numbness.  No nausea or vomiting.  Also reports occasional shortness of breath while climbing stairs or with any other form of exertion.  She has a burning sensation in her chest as well however thinks that this is most likely associated with her reflux.  Symptoms will last for about an hour and then subside.  They can occur randomly.  Prediabetes Not currently on any medications.  Depression She is currently on Lexapro 10 mg daily.  Symptoms seem to have worsened lately.  She is not sure if this is due to needing to increase her dose or if it is due to her blood pressures running high.  Depression screen [facility] 2/9 03/09/2018  Decreased Interest 0  Down, Depressed, Hopeless 0  PHQ - 2 Score 0  Altered sleeping 1  Tired, decreased energy 1  Change in appetite 1  Feeling bad or failure about yourself  0  Trouble concentrating 0  Moving slowly or fidgety/restless 0  Suicidal thoughts 0  PHQ-9 Score 3  Difficult doing work/chores Not difficult at all   GAD 7 : Generalized Anxiety Score 03/09/2018  Nervous, Anxious, on Edge 2  Control/stop worrying 3  Worry too much - different things 3  Trouble relaxing 2  Restless 1  Easily annoyed or irritable 1  Afraid - awful might happen 0  Total GAD 7 Score 12  Anxiety Difficulty Not difficult at all   GERD Follows with gastroenterology.  Currently on Dexilant 60 mg and ranitidine 300 mg nightly.  ROS: Per HPI  PMH: She reports  that she has never smoked. She has never used smokeless tobacco. She reports that she does not drink alcohol or use drugs.  Objective:  Physical Exam: BP (!) 158/92 (BP Location: Left Arm, Patient Position: Sitting, Cuff Size: Normal)   Pulse 68   Temp 99.1 F (37.3 C) (Oral)   Ht 4' 11.5" (1.511 m)   Wt 135 lb 3.2 oz (61.3 kg)   SpO2 98%   BMI 26.85 kg/m   Wt Readings from Last 3 Encounters:  03/09/18 135 lb 3.2 oz (61.3 kg)  01/18/17 131 lb 6.4 oz (59.6 kg)  12/28/16 129 lb (58.5 kg)  Gen: NAD, resting comfortably CV: RRR with no murmurs appreciated Pulm: NWOB, CTAB with no crackles, wheezes, or rhonchi GI: Normal bowel sounds present. Soft, Nontender, Nondistended. MSK: No edema, cyanosis, or clubbing noted Skin: Warm, dry Neuro: Grossly normal, moves all extremities Psych: Normal affect and thought content  EKG: Normal sinus rhythm.  No ischemic changes.  Assessment/Plan:  GERD (gastroesophageal reflux disease) Will follow up with GI soon. Continue dexilant 60mg  and ranitidine 300mg . Check B12.   Prediabetes Check A1c today.  Hypertension Above goal.  We will increase her metoprolol to 100 mg once daily.  Over this should help some with her anxiety symptoms as well.  Advised patient to keep close eye on her blood pressure  over the next few weeks and let me know if persistently 140/90 or higher.  Discussed potential adverse reactions.  Follow-up with me in 3 months.  Dyslipidemia Check lipid panel.  Depression, major, single episode, complete remission ([place]) PHQ stable however her GAD score is elevated.  Will increase her dose of metoprolol has been above which should hopefully help some with her anxiety symptoms.  She declined increasing dose of Lexapro today.  Will check CBC, CMP, TSH.  Follow-up with me in 3 months.  Discussed reasons to return to care earlier.  Chest Pain Atypical history, however does have several risk factors including hypertension and  dyslipidemia.  EKG today was within normal limits.  We will check blood work as noted above.  She will follow-up with GI.  May need stress testing to rule out ischemic heart disease.  If symptoms do not improve with better management for her reflux.  Discussed reasons to return to care and seek emergent care.  Time Spent: I spent >40 minutes face-to-face with the patient, with more than half spent on coordinating care and counseling for plan for her hypertension, dyslipidemia, dyslipidemia, GERD, and chest pain.  Algis Greenhouse. [name], MD 03/09/2018 9:28 AM

## 2018-03-13 ENCOUNTER — Telehealth: Payer: Self-pay | Admitting: *Deleted

## 2018-03-13 NOTE — Telephone Encounter (Signed)
Noted  

## 2018-03-13 NOTE — Progress Notes (Signed)
Please inform patient of the following:  Her blood sugar is elevated, but stable. Would recommend starting metformin 750mg  daily to help lower blood sugar and reduce risk of heart attack and stroke. It will also help some with weight loss. Please send in if she is willing to start.  Her cholesterol is elevated. Recommend starting lipitor 40mg  daily to lower her numbers and reduce risk of heart attack and stroke. We should recheck in 6-12 months.  All of her other labs are normal.   [place] [name], MD 03/13/2018 8:43 AM

## 2018-03-13 NOTE — Telephone Encounter (Signed)
See note

## 2018-03-13 NOTE — Telephone Encounter (Signed)
Patient was notified of her results and medications advised- she has researched them and has decided she wants to hold off until she comes in to see Dr [name] in April- Do not send in metformin or Lipitor yet- she wants to discuss first.

## 2018-03-16 DIAGNOSIS — K449 Diaphragmatic hernia without obstruction or gangrene: Secondary | ICD-10-CM | POA: Diagnosis not present

## 2018-03-16 DIAGNOSIS — R0602 Shortness of breath: Secondary | ICD-10-CM | POA: Diagnosis not present

## 2018-03-16 DIAGNOSIS — K219 Gastro-esophageal reflux disease without esophagitis: Secondary | ICD-10-CM | POA: Diagnosis not present

## 2018-03-21 ENCOUNTER — Ambulatory Visit: Payer: Self-pay

## 2018-03-21 NOTE — Telephone Encounter (Signed)
Call placed to patient. Left VM to return call to office 

## 2018-03-21 NOTE — Telephone Encounter (Signed)
Patient called and asked why her BP's are going up. She says last night it was 164/101 She says she saw Dr. [name] on 03/09/18 and was started on Metoprolol 100 mg daily. She says she's taking it as prescribed in the morning with breakfast. She says she didn't check her BP this morning. She has symptoms of a headache. She says she has not missed doses. I advised to come back in for evaluation of BP, she asks if Dr. [name] can call and give advise over the phone, so she will not have to come back so close to the last visit. I advised I will send this to Dr. [name]. I advised to check BP tonight when she gets home and again in the morning, then call the office with the readings, so he will have a better idea of her BP readings, she verbalized understanding.   Reason for Disposition . Systolic BP  >= 390 OR Diastolic >= 300  Answer Assessment - Initial Assessment Questions 1. BLOOD PRESSURE: "What is the blood pressure?" "Did you take at least two measurements 5 minutes apart?"     164/101  2. ONSET: "When did you take your blood pressure?"     Last night 3. HOW: "How did you obtain the blood pressure?" (e.g., visiting nurse, automatic home BP monitor)     Automatic home BP monitor 4. HISTORY: "Do you have a history of high blood pressure?"     Yes 5. MEDICATIONS: "Are you taking any medications for blood pressure?" "Have you missed any doses recently?"     Yes, not missed doses 6. OTHER SYMPTOMS: "Do you have any symptoms?" (e.g., headache, chest pain, blurred vision, difficulty breathing, weakness)     Headache 7. PREGNANCY: "Is there any chance you are pregnant?" "When was your last menstrual period?"     No  Protocols used: HIGH BLOOD PRESSURE-A-AH

## 2018-03-21 NOTE — Telephone Encounter (Signed)
Patient called, left VM to return call to the office to discuss elevated BP.

## 2018-03-22 NOTE — Telephone Encounter (Signed)
See note

## 2018-03-22 NOTE — Telephone Encounter (Signed)
Message from Esaw Dace sent at 03/22/2018 8:19 AM EST   Summary: BP Followup   Per NT, pt called back this morning to give BP reading before breakfast. 155/104 ----- Message from Oneta Rack sent at 03/21/2018 4:32 PM EST ----- Patient states BP reading 164/101 last night, patient denies headaches, shortness of breath and difficulty breathing, sweating. Patient sates BP medication changed 03/09/2018 and would like to discuss why BP is still slightly high, please advise           Pt returned call with her b/p reading. Her b/p this morning is 155/104. She denies having symptoms this morning. She did have a headache yesterday when her b/p was higher. Her b/p medication was recently changed. And she is getting ready to take her medication this morning. She recently bought her b/p monitor. She fills like she needs to her  have blood pressure readjustment. Notified flow at [facility] at [place]. Requesting to have the provider or his nurse call the patient back regarding her readings. She is at work and if she can not answer, leave message and she will call back.  Pt advised that if she starts having a headache, nausea, sweating, dizziness or blurred vision she should be taken to the nearest urgent medical facility. Pt voiced understanding. Routing to [facility] at [place].

## 2018-03-22 NOTE — Telephone Encounter (Signed)
Recommend doubling dose to 200mg  daily and following up with me in the next several days.  Algis Greenhouse. [name], MD 03/22/2018 9:03 AM

## 2018-03-22 NOTE — Telephone Encounter (Signed)
Please advise 

## 2018-03-22 NOTE — Telephone Encounter (Signed)
Attempted to contact patient.  LM to return call.  When patient calls back, please relay Dr. Marigene Ehlers advice below.  CRM Placed.

## 2018-03-27 ENCOUNTER — Ambulatory Visit: Payer: Self-pay | Admitting: *Deleted

## 2018-03-27 ENCOUNTER — Encounter: Payer: Self-pay | Admitting: Family Medicine

## 2018-03-27 ENCOUNTER — Ambulatory Visit (INDEPENDENT_AMBULATORY_CARE_PROVIDER_SITE_OTHER): Payer: BLUE CROSS/BLUE SHIELD | Admitting: Family Medicine

## 2018-03-27 ENCOUNTER — Other Ambulatory Visit: Payer: Self-pay | Admitting: Family Medicine

## 2018-03-27 ENCOUNTER — Ambulatory Visit (INDEPENDENT_AMBULATORY_CARE_PROVIDER_SITE_OTHER): Payer: BLUE CROSS/BLUE SHIELD

## 2018-03-27 VITALS — BP 142/88 | HR 68 | Temp 97.8°F | Ht 59.5 in | Wt 135.0 lb

## 2018-03-27 DIAGNOSIS — K219 Gastro-esophageal reflux disease without esophagitis: Secondary | ICD-10-CM | POA: Diagnosis not present

## 2018-03-27 DIAGNOSIS — J90 Pleural effusion, not elsewhere classified: Secondary | ICD-10-CM | POA: Diagnosis not present

## 2018-03-27 DIAGNOSIS — R059 Cough, unspecified: Secondary | ICD-10-CM

## 2018-03-27 DIAGNOSIS — R7303 Prediabetes: Secondary | ICD-10-CM

## 2018-03-27 DIAGNOSIS — I10 Essential (primary) hypertension: Secondary | ICD-10-CM

## 2018-03-27 DIAGNOSIS — R05 Cough: Secondary | ICD-10-CM

## 2018-03-27 DIAGNOSIS — E785 Hyperlipidemia, unspecified: Secondary | ICD-10-CM

## 2018-03-27 MED ORDER — METOPROLOL SUCCINATE ER 200 MG PO TB24: 200.0000 mg | ORAL_TABLET | Freq: Every day | ORAL | 3 refills | Status: DC

## 2018-03-27 MED ORDER — AMOXICILLIN-POT CLAVULANATE 875-125 MG PO TABS: 1.0000 | ORAL_TABLET | Freq: Two times a day (BID) | ORAL | 0 refills | Status: DC

## 2018-03-27 NOTE — Telephone Encounter (Signed)
Spoke with patient see note from results.

## 2018-03-27 NOTE — Patient Instructions (Signed)
It was very nice to see you today!  Your blood looks much better.  I will send in a new prescription for 200 mg tablet once daily.  Please keep an eye on this and let me know if persistently 140/90 or higher.  No other medication changes today.  Please make sure that you are exercising at least 30 to 60 minutes a day or 150 to 300 minutes/week.  Please continue working on your diet.  Come back to see me in 3 to 6 months to recheck your blood sugar and blood pressure.  We will check a chest xray today.   Take care, Dr [name]       Eat at least 3 REAL meals and 1-2 snacks per day.  Aim for no more than 5 hours between eating.  Eat breakfast within one hour of getting up.    Obtain twice as many fruits/vegetables as protein or carbohydrate foods for both lunch and dinner.   Cut down on sweet beverages. This includes juice, soda, and sweet tea.    Exercise at least 150-300 minutes every week.

## 2018-03-27 NOTE — Assessment & Plan Note (Signed)
Stable.  Managed per GI.  Continue Dexilant 60 mg daily and famotidine 20 mg twice daily.

## 2018-03-27 NOTE — Progress Notes (Signed)
Please inform patient of the following:  The radiologist read her x-ray and found a very small area that could be consistent with pneumonia.  We will treat this with Augmentin 1 pill twice daily for 10 days.  Would like for her to let us know if her symptoms worsen or do not improve with this.

## 2018-03-27 NOTE — Assessment & Plan Note (Signed)
Chronic problem.  Stable.  Found to have elevated LDL on blood work from earlier this month.  She declined starting statin today.  She will continue working on diet and exercise.  We can recheck in 6 to 12 months.

## 2018-03-27 NOTE — Progress Notes (Signed)
   Subjective:  Debbie Hill is a 58 y.o. female who presents today with a chief complaint of HTN.   HPI:  HTN, chronic problem Last seen about 3 weeks ago.  We increased her metoprolol to 100 mg once daily. She continued to have elevated BP readings at home and we increased her dose via phone to 200mg  daily about 5 days ago. She initially had some lightheadedness with this dose however has not had any side effects for the past few days.  No chest pain or shortness of breath.  Overall feels much better.  Dyslipidemia Found on blood work 3 weeks ago.  LDL 158.  Has never been on any medications before.  Does not regularly exercise.  Does not follow any specific diets.  Prediabetes Not on blood work 3 weeks ago.  A1c 6.0.  Has never been on any medications before. Does not regularly exercise. Does not follow any specific diets.   GERD She saw GI couple of weeks ago.  She was switched from the ranitidine to Pepcid.  She was continued on Dexilant 60 mg daily.  Overall symptoms are stable.  She still has a chronic cough which her GI doctor thinks could be due to her reflux.  ROS: Per HPI  PMH: She reports that she has never smoked. She has never used smokeless tobacco. She reports that she does not drink alcohol or use drugs.  Objective:  Physical Exam: BP (!) 142/88 (BP Location: Left Arm, Patient Position: Sitting, Cuff Size: Normal)   Pulse 68   Temp 97.8 F (36.6 C) (Oral)   Ht 4' 11.5" (1.511 m)   Wt 135 lb (61.2 kg)   SpO2 95%   BMI 26.81 kg/m   Wt Readings from Last 3 Encounters:  03/27/18 135 lb (61.2 kg)  03/09/18 135 lb 3.2 oz (61.3 kg)  01/18/17 131 lb 6.4 oz (59.6 kg)  Gen: NAD, resting comfortably CV: RRR with no murmurs appreciated Pulm: NWOB, CTAB with no crackles, wheezes, or rhonchi GI: Normal bowel sounds present. Soft, Nontender, Nondistended. MSK: No edema, cyanosis, or clubbing noted Skin: Warm, dry Neuro: Grossly normal, moves all extremities Psych: Normal  affect and thought content  Assessment/Plan:  Prediabetes Chronic problem.  Stable.  Last A1c 6.0.  Recommended starting metformin however patient declined.  Follow-up with me in 3 to 6 months.  Recheck A1c at that time.  Discussed importance of healthy diet and regular exercise.  Hypertension Much better today.  Continue metoprolol 200 mg daily.  Discussed home blood pressure monitoring with goal 140/90 or lower.  Discussed importance of regular exercise and low-salt diet.  She will follow-up with me in 3 to 6 months, or sooner if blood pressure readings are not at goal.  GERD (gastroesophageal reflux disease) Stable.  Managed per GI.  Continue Dexilant 60 mg daily and famotidine 20 mg twice daily.  Dyslipidemia Chronic problem.  Stable.  Found to have elevated LDL on blood work from earlier this month.  She declined starting statin today.  She will continue working on diet and exercise.  We can recheck in 6 to 12 months.  Cough Chest x-ray with very small infiltrate.  She does not have any clinical signs of pneumonia, however we will cover for possible aspiration pneumonia with a course of Augmentin.  Algis Greenhouse. [name], MD 03/27/2018 9:27 AM

## 2018-03-27 NOTE — Assessment & Plan Note (Signed)
Chronic problem.  Stable.  Last A1c 6.0.  Recommended starting metformin however patient declined.  Follow-up with me in 3 to 6 months.  Recheck A1c at that time.  Discussed importance of healthy diet and regular exercise.

## 2018-03-27 NOTE — Assessment & Plan Note (Signed)
Much better today.  Continue metoprolol 200 mg daily.  Discussed home blood pressure monitoring with goal 140/90 or lower.  Discussed importance of regular exercise and low-salt diet.  She will follow-up with me in 3 to 6 months, or sooner if blood pressure readings are not at goal.

## 2018-03-27 NOTE — Telephone Encounter (Signed)
Pt calling to question RX for Augmentin. States this was not mentioned at visit today.  CXR results were not released to [place]. TN did read assessment note from Dr. [name] from [facility]. Pt requesting CXR results.  Also questioning if [facility] will continue to pay for Toprol if this is a maintance dose. RX was sent to [facility] on [address].  Please advise: [phone]   Reason for Disposition . Caller has NON-URGENT medication question about med that PCP prescribed and triager unable to answer question  Answer Assessment - Initial Assessment Questions 1. SYMPTOMS: "Do you have any symptoms?"     No; question regarding med 2. SEVERITY: If symptoms are present, ask "Are they mild, moderate or severe?"     n/a  Protocols used: MEDICATION QUESTION CALL-A-AH

## 2018-03-27 NOTE — Telephone Encounter (Signed)
See note

## 2018-03-27 NOTE — Telephone Encounter (Signed)
Copied from [place] [id number]. Topic: Quick Communication - See Telephone Encounter >> Mar 27, 2018  1:32 PM Sheran Luz wrote: CRM for notification. See Telephone encounter for: 03/27/18.  Patient called stating that the medication metoprolol succinate (TOPROL-XL) 200 MG 24 hr tablet was sent to the wrong pharmacy. Patient states it was supposed to be sent to [place], [place] [phone] (Phone) [phone] (Fax)  Please advise.

## 2018-03-27 NOTE — Telephone Encounter (Signed)
Called [facility] and caneled prescription-medication sent to [facility] per pt request.

## 2018-04-17 ENCOUNTER — Other Ambulatory Visit: Payer: Self-pay | Admitting: Obstetrics and Gynecology

## 2018-04-17 DIAGNOSIS — N6320 Unspecified lump in the left breast, unspecified quadrant: Secondary | ICD-10-CM

## 2018-05-18 ENCOUNTER — Other Ambulatory Visit: Payer: Self-pay

## 2018-05-18 ENCOUNTER — Ambulatory Visit
Admission: RE | Admit: 2018-05-18 | Discharge: 2018-05-18 | Disposition: A | Discharge status: Home / Self Care | Payer: BLUE CROSS/BLUE SHIELD | Source: Ambulatory Visit | Attending: Obstetrics and Gynecology | Admitting: Obstetrics and Gynecology

## 2018-05-18 DIAGNOSIS — N6489 Other specified disorders of breast: Secondary | ICD-10-CM | POA: Diagnosis not present

## 2018-05-18 DIAGNOSIS — K219 Gastro-esophageal reflux disease without esophagitis: Secondary | ICD-10-CM | POA: Diagnosis not present

## 2018-05-18 DIAGNOSIS — R928 Other abnormal and inconclusive findings on diagnostic imaging of breast: Secondary | ICD-10-CM | POA: Diagnosis not present

## 2018-05-18 DIAGNOSIS — Z79899 Other long term (current) drug therapy: Secondary | ICD-10-CM | POA: Diagnosis not present

## 2018-05-18 DIAGNOSIS — N6320 Unspecified lump in the left breast, unspecified quadrant: Secondary | ICD-10-CM

## 2018-05-18 DIAGNOSIS — E559 Vitamin D deficiency, unspecified: Secondary | ICD-10-CM | POA: Diagnosis not present

## 2018-06-20 ENCOUNTER — Other Ambulatory Visit: Payer: Self-pay | Admitting: Family Medicine

## 2018-06-20 MED ORDER — ESCITALOPRAM OXALATE 10 MG PO TABS: 10.0000 mg | ORAL_TABLET | Freq: Every day | ORAL | 1 refills | Status: DC

## 2018-06-27 ENCOUNTER — Ambulatory Visit: Payer: BLUE CROSS/BLUE SHIELD | Admitting: Family Medicine

## 2018-11-06 ENCOUNTER — Ambulatory Visit (INDEPENDENT_AMBULATORY_CARE_PROVIDER_SITE_OTHER): Payer: BC Managed Care – PPO | Admitting: Family Medicine

## 2018-11-06 ENCOUNTER — Other Ambulatory Visit: Payer: Self-pay

## 2018-11-06 ENCOUNTER — Other Ambulatory Visit (INDEPENDENT_AMBULATORY_CARE_PROVIDER_SITE_OTHER): Payer: BC Managed Care – PPO

## 2018-11-06 ENCOUNTER — Encounter: Payer: Self-pay | Admitting: Family Medicine

## 2018-11-06 VITALS — BP 142/80 | HR 60 | Temp 97.9°F | Ht 59.5 in | Wt 132.0 lb

## 2018-11-06 DIAGNOSIS — Z6826 Body mass index (BMI) 26.0-26.9, adult: Secondary | ICD-10-CM

## 2018-11-06 DIAGNOSIS — I10 Essential (primary) hypertension: Secondary | ICD-10-CM | POA: Diagnosis not present

## 2018-11-06 DIAGNOSIS — R3915 Urgency of urination: Secondary | ICD-10-CM

## 2018-11-06 DIAGNOSIS — E663 Overweight: Secondary | ICD-10-CM

## 2018-11-06 DIAGNOSIS — R300 Dysuria: Secondary | ICD-10-CM

## 2018-11-06 DIAGNOSIS — F325 Major depressive disorder, single episode, in full remission: Secondary | ICD-10-CM

## 2018-11-06 LAB — POCT URINALYSIS DIPSTICK
Bilirubin, UA: POSITIVE
Blood, UA: NEGATIVE
Glucose, UA: NEGATIVE
Ketones, UA: POSITIVE
Leukocytes, UA: NEGATIVE
Nitrite, UA: NEGATIVE
Protein, UA: POSITIVE — AB
Spec Grav, UA: >=1.03 — AB (ref 1.010–1.025)
Urobilinogen, UA: 1 E.U./dL
pH, UA: 5.5 (ref 5.0–8.0)

## 2018-11-06 MED ORDER — CEPHALEXIN 500 MG PO CAPS: 500.0000 mg | ORAL_CAPSULE | Freq: Two times a day (BID) | ORAL | 0 refills | Status: AC

## 2018-11-06 NOTE — Assessment & Plan Note (Signed)
Continue Lexapro 10 mg daily

## 2018-11-06 NOTE — Patient Instructions (Signed)
It was nice to see you!  You have a urinary tract infection. Please start the antibiotic.  We will check a urine culture to make sure you do not have a resistant bacteria. We will call you if we need to change your medications.   Please make sure you are drinking plenty of fluids over the next few days.  If your symptoms do not improve over the next 5-7 days, or if they worsen, please let us know. Please also let us know if you have worsening back pain, fevers, chills, or body aches.   Take care, Dr Parker  

## 2018-11-06 NOTE — Progress Notes (Signed)
   Chief Complaint:  Debbie Hill is a 58 y.o. female who presents for same day appointment with a chief complaint of urinary urgency.   Assessment/Plan:  Urinary urgency UA without clear signs of infection however history consistent with UTI.  Will start Keflex 500 mg twice daily x7 days.  Encouraged good oral hydration- she is quite dehydrated based on her UA.  Urine culture is pending. She will follow-up with me in about a week if symptoms are not improving.  Hypertension Near goal today.  Continue metoprolol 200 mg daily.  Follow-up in a few months for CPE.  Depression, major, single episode, complete remission ([facility]) Continue Lexapro 10 mg daily.  Body mass index is 26.21 kg/m. / Overweight BMI Metric Follow Up - 11/06/18 1150      BMI Metric Follow Up-Please document annually   BMI Metric Follow Up  Education provided          Subjective:  HPI:  Urinary Urgency, acute problem Started about 10 days ago. Feels the urge to urinate every 20-30 minutes.  Associated with lower abdominal and lower back pain.  Has very slight burning when urinating.  She is also had increased fatigue and malaise.  No fevers or chills.  No nausea or vomiting.  No other obvious alleviating or aggravating factors.  Her stable, chronic medical conditions are outlined below:   # HTN - On metoprolol succinate 200mg  daily and tolerating well - ROS: No reported chest pain or shortness of breath  # Depression / Anxiety - On lexapro 10mg  daily and tolerating well    ROS: Per HPI  PMH: She reports that she has never smoked. She has never used smokeless tobacco. She reports that she does not drink alcohol or use drugs.      Objective:  Physical Exam: BP (!) 142/80   Pulse 60   Temp 97.9 F (36.6 C)   Ht 4' 11.5" (1.511 m)   Wt 132 lb (59.9 kg)   SpO2 96%   BMI 26.21 kg/m   Gen: NAD, resting comfortably CV: Regular rate and rhythm with no murmurs appreciated Pulm: Normal work of breathing,  clear to auscultation bilaterally with no crackles, wheezes, or rhonchi GI: Normal bowel sounds present. Soft, Nontender, Nondistended. MSK: No edema, cyanosis, or clubbing noted.  No CVA tenderness. Skin: Warm, dry Neuro: Grossly normal, moves all extremities Psych: Normal affect and thought content  Results for orders placed or performed in visit on 11/06/18 (from the past 24 hour(s))  POCT urinalysis dipstick     Status: Abnormal   Collection Time: 11/06/18 11:05 AM  Result Value Ref Range   Color, UA Yellow    Clarity, UA Clear    Glucose, UA Negative Negative   Bilirubin, UA Positive    Ketones, UA Positive    Spec Grav, UA >=1.030 (A) 1.010 - 1.025   Blood, UA Negative    pH, UA 5.5 5.0 - 8.0   Protein, UA Positive (A) Negative   Urobilinogen, UA 1.0 0.2 or 1.0 E.U./dL   Nitrite, UA Negative    Leukocytes, UA Negative Negative   Appearance     Odor          Ramar Nobrega M. [name], MD 11/06/2018 11:53 AM

## 2018-11-06 NOTE — Assessment & Plan Note (Signed)
Near goal today.  Continue metoprolol 200 mg daily.  Follow-up in a few months for CPE.

## 2018-11-08 LAB — URINE CULTURE
MICRO NUMBER:: 835665
SPECIMEN QUALITY:: ADEQUATE

## 2018-11-08 NOTE — Progress Notes (Signed)
Please inform patient of the following:  Urine culture inconclusive. Would like for her to finish her antibiotics and let us know if her symptoms have not improved.  Debbie Hill. [name], MD 11/08/2018 7:54 AM

## 2018-11-19 DIAGNOSIS — Z6825 Body mass index (BMI) 25.0-25.9, adult: Secondary | ICD-10-CM | POA: Diagnosis not present

## 2018-11-19 DIAGNOSIS — R102 Pelvic and perineal pain: Secondary | ICD-10-CM | POA: Diagnosis not present

## 2018-11-19 DIAGNOSIS — Z130 Encounter for screening for diseases of the blood and blood-forming organs and certain disorders involving the immune mechanism: Secondary | ICD-10-CM | POA: Diagnosis not present

## 2018-11-19 DIAGNOSIS — Z01419 Encounter for gynecological examination (general) (routine) without abnormal findings: Secondary | ICD-10-CM | POA: Diagnosis not present

## 2018-11-19 DIAGNOSIS — Z124 Encounter for screening for malignant neoplasm of cervix: Secondary | ICD-10-CM | POA: Diagnosis not present

## 2018-11-19 LAB — CBC AND DIFFERENTIAL
HCT: 48 — AB (ref 36–46)
Hemoglobin: 16.1 — AB (ref 12.0–16.0)
Neutrophils Absolute: 3
Platelets: 295 (ref 150–399)
WBC: 5.6

## 2018-11-19 LAB — HEMOGLOBIN A1C: Hemoglobin A1C: 5.8

## 2018-11-30 ENCOUNTER — Encounter: Payer: Self-pay | Admitting: Family Medicine

## 2018-11-30 DIAGNOSIS — R102 Pelvic and perineal pain: Secondary | ICD-10-CM | POA: Diagnosis not present

## 2018-12-04 ENCOUNTER — Encounter: Payer: Self-pay | Admitting: Family Medicine

## 2018-12-04 DIAGNOSIS — R9389 Abnormal findings on diagnostic imaging of other specified body structures: Secondary | ICD-10-CM | POA: Diagnosis not present

## 2018-12-04 DIAGNOSIS — N858 Other specified noninflammatory disorders of uterus: Secondary | ICD-10-CM | POA: Diagnosis not present

## 2018-12-04 DIAGNOSIS — Z87440 Personal history of urinary (tract) infections: Secondary | ICD-10-CM | POA: Diagnosis not present

## 2018-12-18 ENCOUNTER — Telehealth: Payer: Self-pay | Admitting: Family Medicine

## 2018-12-18 NOTE — Telephone Encounter (Signed)
Copied from [place]. Topic: General - Inquiry >> Dec 18, 2018  2:06 PM Debbie Hill wrote: Reason for CRM: Pt is having surgery and has a surgical clearance form that needs to be completed. Please advise if she can drop this off or if she needs an appt for it to be completed >> Dec 18, 2018  2:37 PM Carole Binning B wrote: Please set up appointment

## 2018-12-24 ENCOUNTER — Ambulatory Visit (INDEPENDENT_AMBULATORY_CARE_PROVIDER_SITE_OTHER): Payer: BC Managed Care – PPO | Admitting: Family Medicine

## 2018-12-24 ENCOUNTER — Other Ambulatory Visit: Payer: Self-pay

## 2018-12-24 ENCOUNTER — Encounter: Payer: Self-pay | Admitting: Family Medicine

## 2018-12-24 VITALS — BP 136/78 | HR 82 | Temp 98.0°F | Ht 59.5 in | Wt 133.0 lb

## 2018-12-24 DIAGNOSIS — Z01818 Encounter for other preprocedural examination: Secondary | ICD-10-CM | POA: Diagnosis not present

## 2018-12-24 DIAGNOSIS — E785 Hyperlipidemia, unspecified: Secondary | ICD-10-CM

## 2018-12-24 DIAGNOSIS — R7303 Prediabetes: Secondary | ICD-10-CM | POA: Diagnosis not present

## 2018-12-24 DIAGNOSIS — K219 Gastro-esophageal reflux disease without esophagitis: Secondary | ICD-10-CM | POA: Diagnosis not present

## 2018-12-24 DIAGNOSIS — I10 Essential (primary) hypertension: Secondary | ICD-10-CM

## 2018-12-24 DIAGNOSIS — F325 Major depressive disorder, single episode, in full remission: Secondary | ICD-10-CM

## 2018-12-24 NOTE — Assessment & Plan Note (Signed)
Diet controlled.  

## 2018-12-24 NOTE — Progress Notes (Signed)
Chief Complaint:  Debbie Hill is a 58 y.o. female who presents today for consultation for surgical clearance at the request of Dr. Wilhelmenia Blase for total hysterectomy.  Assessment/Plan:  Encounter for preop evaluation Patient has an overall low risk for cardiovascular event.  Her blood pressure, blood sugar, and cholesterol levels are under control.  She has excellent functional status.  EKG today with no ischemic changes.  Patient is cleared to have surgery.  Hypertension At goal.  Continue metoprolol 200 mg daily.  GERD (gastroesophageal reflux disease) Stable.  Continue Dexilant 60 mg daily and famotidine 20 mg twice daily.  Prediabetes Well-controlled.  Last A1c 5.8.  Dyslipidemia Diet controlled.  Depression, major, single episode, complete remission (HCC) Stable.  Continue Lexapro 10 mg daily.     Subjective:  HPI:  Pelvic Pain /preop evaluation for total hysterectomy Patient has had ongoing issues with pelvic pain and pressure for several months.  She was seen here about 6 weeks ago and started on antibiotics for possible UTI.  Symptoms never significantly improved.  She followed up with her gynecologist who got an ultrasound and she was found to have a thickened endometrium and polycystic ovaries.  Endometrial biopsy was performed which was reportedly benign.  Due to her ongoing symptoms it was decided that she would be a good candidate for total hysterectomy for symptom relief.  Symptoms seem to be stable over the last few weeks.  No chest pain or shortness of breath.  No nausea or vomiting.  Patient is able to exercise normally without any issue with chest pain or shortness of breath.  Her home blood pressure readings have been at goal.  She will be getting her surgery in about a month.   Her stable, chronic medical conditions are outlined below:  # HTN - On metoprolol succinate 200mg  daily and tolerating well - ROS: No reported chest pain or shortness of breath  # GERD  - Follows with GI - On dexilant 50mg  daily and ranitidine 300mg  at bedtime  # Depression / Anxiety - On lexapro 10mg  daily and tolerating well - ROS: No SI or HI  # HLD - Diet controlled  # Prediabetes - Diet controlled  ROS: , otherwise a complete review of systems was negative.   PMH:  The following were reviewed and entered/updated in epic: Past Medical History:  Diagnosis Date  . Chronic headaches   . Depression   . GERD (gastroesophageal reflux disease)   . Hypertension    Patient Active Problem List   Diagnosis Date Noted  . GERD (gastroesophageal reflux disease) 03/09/2018  . Dyslipidemia 01/18/2017  . Prediabetes 01/18/2017  . Hypertension 12/28/2016  . Depression, major, single episode, complete remission ([place]) 12/28/2016   Past Surgical History:  Procedure Laterality Date  . LASIK Bilateral 2012    Family History  Problem Relation Age of Onset  . Cancer Mother        Lung Cancer  . Hypertension Mother   . Cancer Father        Lung Cancer  . Hypertension Sister   . Depression Sister   . Hypertension Paternal Grandmother   . Cancer Paternal Grandfather        Esophagus Cancer  . Breast cancer Neg Hx     Medications- reviewed and updated Current Outpatient Medications  Medication Sig Dispense Refill  . aspirin-acetaminophen-caffeine (EXCEDRIN MIGRAINE) 250-250-65 MG per tablet Take 1 tablet by mouth daily as needed for headache or migraine.    . cholecalciferol (VITAMIN  D) 1000 units tablet Take 1,000 Units daily by mouth.    . dexlansoprazole (DEXILANT) 60 MG capsule Take 60 mg by mouth daily.    Marland Kitchen escitalopram (LEXAPRO) 10 MG tablet Take 1 tablet (10 mg total) by mouth daily. 90 tablet 1  . famotidine (PEPCID) 20 MG tablet TK 1 T PO BID    . metoprolol (TOPROL-XL) 200 MG 24 hr tablet Take 1 tablet (200 mg total) by mouth daily. Take with or immediately following a meal. 90 tablet 3  . Multiple Vitamins-Minerals (MULTIVITAMIN PO) Take 1 tablet by  mouth daily.     No current facility-administered medications for this visit.     Allergies-reviewed and updated No Known Allergies  Social History   Socioeconomic History  . Marital status: Married    Spouse name: Not on file  . Number of children: 2  . Years of education: Not on file  . Highest education level: Not on file  Occupational History  . Not on file  Social Needs  . Financial resource strain: Not on file  . Food insecurity    Worry: Not on file    Inability: Not on file  . Transportation needs    Medical: Not on file    Non-medical: Not on file  Tobacco Use  . Smoking status: Never Smoker  . Smokeless tobacco: Never Used  Substance and Sexual Activity  . Alcohol use: No  . Drug use: No  . Sexual activity: Yes    Partners: Male  Lifestyle  . Physical activity    Days per week: Not on file    Minutes per session: Not on file  . Stress: Not on file  Relationships  . Social Herbalist on phone: Not on file    Gets together: Not on file    Attends religious service: Not on file    Active member of club or organization: Not on file    Attends meetings of clubs or organizations: Not on file    Relationship status: Not on file  Other Topics Concern  . Not on file  Social History Narrative  . Not on file         Objective:  Physical Exam: BP 136/78   Pulse 82   Temp 98 F (36.7 C)   Ht 4' 11.5" (1.511 m)   Wt 133 lb (60.3 kg)   SpO2 99%   BMI 26.41 kg/m   Gen: NAD, resting comfortably CV: Regular rate and rhythm with no murmurs appreciated Pulm: Normal work of breathing, clear to auscultation bilaterally with no crackles, wheezes, or rhonchi GI: Normal bowel sounds present. Soft, Nontender, Nondistended. MSK: No edema, cyanosis, or clubbing noted Skin: Warm, dry Neuro: Grossly normal, moves all extremities Psych: Normal affect and thought content  EKG: Sinus bradycardia. No ischemic changes.       A copy of this note will be  forwarded to the requesting physician.   Algis Greenhouse. [name], MD 12/24/2018 9:00 AM

## 2018-12-24 NOTE — Assessment & Plan Note (Signed)
Stable.  Continue Lexapro 10 mg daily. 

## 2018-12-24 NOTE — Patient Instructions (Addendum)
It was very nice to see you today!  You are clear to have surgery.  Take care, Dr [name]  Please try these tips to maintain a healthy lifestyle:   Eat at least 3 REAL meals and 1-2 snacks per day.  Aim for no more than 5 hours between eating.  If you eat breakfast, please do so within one hour of getting up.    Obtain twice as many fruits/vegetables as protein or carbohydrate foods for both lunch and dinner. (Half of each meal should be fruits/vegetables, one quarter protein, and one quarter starchy carbs)   Cut down on sweet beverages. This includes juice, soda, and sweet tea.    Exercise at least 150 minutes every week.

## 2018-12-24 NOTE — Assessment & Plan Note (Signed)
Well-controlled.  Last A1c 5.8.

## 2018-12-24 NOTE — Assessment & Plan Note (Signed)
Stable.  Continue Dexilant 60 mg daily and famotidine 20 mg twice daily.

## 2018-12-24 NOTE — Assessment & Plan Note (Signed)
At goal.  Continue metoprolol 200 mg daily.

## 2018-12-25 ENCOUNTER — Encounter: Payer: Self-pay | Admitting: Family Medicine

## 2019-01-02 LAB — HM MAMMOGRAPHY

## 2019-01-09 ENCOUNTER — Other Ambulatory Visit: Payer: Self-pay | Admitting: Family Medicine

## 2019-02-05 DIAGNOSIS — R1900 Intra-abdominal and pelvic swelling, mass and lump, unspecified site: Secondary | ICD-10-CM

## 2019-02-05 HISTORY — DX: Intra-abdominal and pelvic swelling, mass and lump, unspecified site: R19.00

## 2019-02-26 NOTE — Pre-Procedure Instructions (Signed)
Kashanti Olshansky  02/26/2019      [facility] [id number] [place], [address] AT [address] [place] [address] Phone: [phone] Fax: [phone]  [facility] [place], [place] [address] [place] [address] Phone: [phone] Fax: [phone]    Your procedure is scheduled on March 05, 2019.  Report to [facility] Entrance "A" at 530 AM.  Call this number if you have problems the morning of surgery:  [phone]   Call [phone] if you have any questions prior to your surgery date Monday-Friday 8am-4pm    Remember:  Do not eat  after midnight.  You may drink clear liquids until 4:30 AM.  Clear liquids allowed are:      Water, Juice (non-citric and without pulp), Carbonated beverages, Clear Tea, Black Coffee only and Gatorade  Please complete your PRE-SURGERY ENSURE that was provided to you by 430 AM the morning of surgery.  Please, if able, drink it in one setting. DO NOT SIP.    Take these medicines the morning of surgery with A SIP OF WATER  Metoprolol (Toprol XL) Dexlansoprazole (Dexilant) Escitalopram (lexapro) Famotidine (pepcid)  7 days prior to surgery STOP taking any Aspirin, Excedrin Migraine, (unless otherwise instructed by your surgeon), Aleve, Naproxen, Ibuprofen, Motrin, Advil, Goody's, BC's, all herbal medications, fish oil, and all vitamins    Day of surgery:  Do not wear jewelry, make-up or nail polish.  Do not wear lotions, powders, or perfumes, or deodorant.  Do not shave 48 hours prior to surgery.    Do not bring valuables to the hospital.  [facility] is not responsible for any belongings or valuables.  IF you are a smoker, DO NOT Smoke 24 hours prior to surgery   IF you wear a CPAP at night please bring your mask, tubing, and machine the morning of surgery    Remember that you must have someone to transport you home  after your surgery, and remain with you for 24 hours if you are discharged the same day. Contacts, dentures or bridgework may not be worn into surgery.  Leave your suitcase in the car.  After surgery it may be brought to your room.  For patients admitted to the hospital, discharge time will be determined by your treatment team.  Patients discharged the day of surgery will not be allowed to drive home.   [facility] Pre[facility] For [facility]  Before surgery, you can play an important role. Because skin is not sterile, your skin needs to be as free of germs as possible. You can reduce the number of germs on your skin by washing with CHG (chlorahexidine gluconate) Soap before surgery.  CHG is an antiseptic cleaner which kills germs and bonds with the skin to continue killing germs even after washing.    Oral Hygiene is also important to reduce your risk of infection.  Remember - BRUSH YOUR TEETH THE MORNING OF SURGERY WITH YOUR REGULAR TOOTHPASTE  Please do not use if you have an allergy to CHG or antibacterial soaps. If your skin becomes reddened/irritated stop using the CHG.  Do not shave (including legs and underarms) for at least 48 hours prior to first CHG shower. It is OK to shave your face.  Please follow these instructions carefully.   1. Shower the NIGHT BEFORE SURGERY and the MORNING OF SURGERY with CHG.   2.  If you chose to wash your hair, wash your hair first as usual with your normal shampoo.  3. After you shampoo, rinse your hair and body thoroughly to remove the shampoo.  4. Use CHG as you would any other liquid soap. You can apply CHG directly to the skin and wash gently with a scrungie or a clean washcloth.   5. Apply the CHG Soap to your body ONLY FROM THE NECK DOWN.  Do not use on open wounds or open sores. Avoid contact with your eyes, ears, mouth and genitals (private parts). Wash Face and genitals (private parts)  with your normal soap.  6. Wash thoroughly, paying special  attention to the area where your surgery will be performed.  7. Thoroughly rinse your body with warm water from the neck down.  8. DO NOT shower/wash with your normal soap after using and rinsing off the CHG Soap.  9. Pat yourself dry with a CLEAN TOWEL.  10. Wear CLEAN PAJAMAS to bed the night before surgery, wear comfortable clothes the morning of surgery  11. Place CLEAN SHEETS on your bed the night of your first shower and DO NOT SLEEP WITH PETS.  Day of Surgery: Shower as above Do not apply any deodorants/lotions.  Please wear clean clothes to the hospital/surgery center.   Remember to brush your teeth WITH YOUR REGULAR TOOTHPASTE.  Please read over the following fact sheets that you were given.

## 2019-02-27 ENCOUNTER — Encounter (HOSPITAL_COMMUNITY)
Admission: RE | Admit: 2019-02-27 | Discharge: 2019-02-27 | Disposition: A | Discharge status: Home / Self Care | Payer: BC Managed Care – PPO | Source: Ambulatory Visit | Attending: Obstetrics and Gynecology | Admitting: Obstetrics and Gynecology

## 2019-02-27 ENCOUNTER — Other Ambulatory Visit: Payer: Self-pay

## 2019-02-27 ENCOUNTER — Encounter (HOSPITAL_COMMUNITY): Payer: Self-pay

## 2019-02-27 DIAGNOSIS — Z01812 Encounter for preprocedural laboratory examination: Secondary | ICD-10-CM | POA: Insufficient documentation

## 2019-02-27 LAB — CBC WITH DIFFERENTIAL/PLATELET
Abs Immature Granulocytes: 0.02 10*3/uL (ref 0.00–0.07)
Basophils Absolute: 0 10*3/uL (ref 0.0–0.1)
Basophils Relative: 1 %
Eosinophils Absolute: 0 10*3/uL (ref 0.0–0.5)
Eosinophils Relative: 1 %
HCT: 48.1 % — ABNORMAL HIGH (ref 36.0–46.0)
Hemoglobin: 15.9 g/dL — ABNORMAL HIGH (ref 12.0–15.0)
Immature Granulocytes: 0 %
Lymphocytes Relative: 25 %
Lymphs Abs: 1.2 10*3/uL (ref 0.7–4.0)
MCH: 27.9 pg (ref 26.0–34.0)
MCHC: 33.1 g/dL (ref 30.0–36.0)
MCV: 84.4 fL (ref 80.0–100.0)
Monocytes Absolute: 0.4 10*3/uL (ref 0.1–1.0)
Monocytes Relative: 8 %
Neutro Abs: 3.2 10*3/uL (ref 1.7–7.7)
Neutrophils Relative %: 65 %
Platelets: 279 10*3/uL (ref 150–400)
RBC: 5.7 MIL/uL — ABNORMAL HIGH (ref 3.87–5.11)
RDW: 13.6 % (ref 11.5–15.5)
WBC: 4.9 10*3/uL (ref 4.0–10.5)
nRBC: 0 % (ref 0.0–0.2)

## 2019-02-27 LAB — BASIC METABOLIC PANEL
Anion gap: 8 (ref 5–15)
BUN: 13 mg/dL (ref 6–20)
CO2: 24 mmol/L (ref 22–32)
Calcium: 8.9 mg/dL (ref 8.9–10.3)
Chloride: 109 mmol/L (ref 98–111)
Creatinine, Ser: 0.69 mg/dL (ref 0.44–1.00)
GFR calc Af Amer: >60 mL/min (ref >60)
GFR calc non Af Amer: >60 mL/min (ref >60)
Glucose, Bld: 108 mg/dL — ABNORMAL HIGH (ref 70–99)
Potassium: 4 mmol/L (ref 3.5–5.1)
Sodium: 141 mmol/L (ref 135–145)

## 2019-02-27 LAB — TYPE AND SCREEN
ABO/RH(D): A POS
Antibody Screen: NEGATIVE

## 2019-02-27 LAB — ABO/RH: ABO/RH(D): A POS

## 2019-02-27 NOTE — Progress Notes (Signed)
PCP - Dr. Sharl Ma- [place]  Cardiologist - Denies  Chest x-ray - 03/27/2018 (E)  EKG - 12/24/2018 (E)  Stress Test - Denies  ECHO - Denies  Cardiac Cath - Denies  AICD-na PM-na LOOP-na  Sleep Study - Denies CPAP - Denies  LABS- 02/27/2019: CBC w/D, BMP, T/S 03/02/2019: COVID 03/05/2019: POC UPreg-surgeon's order  ASA-Denies  ERAS- Yes- 1 Pre-Ensure given  HA1C-Denies Fasting Blood Sugar -  Checks Blood Sugar _____ times a day   Anesthesia- No  Pt denies having chest pain, sob, or fever at this time. All instructions explained to the pt, with a verbal understanding of the material. Pt agrees to go over the instructions while at home for a better understanding. Pt also instructed to self quarantine after being tested for COVID-19. The opportunity to ask questions was provided.   Coronavirus Screening  Have you experienced the following symptoms:  Cough yes/no: No Fever (>100.29F)  yes/no: No Runny nose yes/no: No Sore throat yes/no: No Difficulty breathing/shortness of breath  yes/no: No  Have you or a family member traveled in the last 14 days and where? yes/no: No   If the patient indicates "YES" to the above questions, their PAT will be rescheduled to limit the exposure to others and, the surgeon will be notified. THE PATIENT WILL NEED TO BE ASYMPTOMATIC FOR 14 DAYS.   If the patient is not experiencing any of these symptoms, the PAT nurse will instruct them to NOT bring anyone with them to their appointment since they may have these symptoms or traveled as well.   Please remind your patients and families that hospital visitation restrictions are in effect and the importance of the restrictions.

## 2019-03-02 ENCOUNTER — Other Ambulatory Visit (HOSPITAL_COMMUNITY)
Admission: RE | Admit: 2019-03-02 | Discharge: 2019-03-02 | Disposition: A | Discharge status: Home / Self Care | Payer: BC Managed Care – PPO | Source: Ambulatory Visit | Attending: Obstetrics and Gynecology | Admitting: Obstetrics and Gynecology

## 2019-03-02 DIAGNOSIS — Z20828 Contact with and (suspected) exposure to other viral communicable diseases: Secondary | ICD-10-CM | POA: Diagnosis not present

## 2019-03-02 DIAGNOSIS — Z01812 Encounter for preprocedural laboratory examination: Secondary | ICD-10-CM | POA: Diagnosis not present

## 2019-03-02 LAB — SARS CORONAVIRUS 2 (TAT 6-24 HRS): SARS Coronavirus 2: NEGATIVE

## 2019-03-05 ENCOUNTER — Encounter (HOSPITAL_COMMUNITY): Payer: Self-pay | Admitting: Obstetrics and Gynecology

## 2019-03-05 ENCOUNTER — Inpatient Hospital Stay (HOSPITAL_COMMUNITY): Payer: BC Managed Care – PPO | Admitting: Anesthesiology

## 2019-03-05 ENCOUNTER — Inpatient Hospital Stay (HOSPITAL_COMMUNITY)
Admission: RE | Admit: 2019-03-05 | Discharge: 2019-03-09 | DRG: 742 | Disposition: A | Discharge status: Home / Self Care | Payer: BC Managed Care – PPO | Attending: Obstetrics and Gynecology | Admitting: Obstetrics and Gynecology

## 2019-03-05 ENCOUNTER — Other Ambulatory Visit: Payer: Self-pay

## 2019-03-05 ENCOUNTER — Inpatient Hospital Stay (HOSPITAL_COMMUNITY): Payer: BC Managed Care – PPO | Admitting: Vascular Surgery

## 2019-03-05 ENCOUNTER — Encounter (HOSPITAL_COMMUNITY)
Admission: RE | Disposition: A | Discharge status: Home / Self Care | Payer: Self-pay | Source: Home / Self Care | Attending: Obstetrics and Gynecology

## 2019-03-05 DIAGNOSIS — Z8249 Family history of ischemic heart disease and other diseases of the circulatory system: Secondary | ICD-10-CM

## 2019-03-05 DIAGNOSIS — K9189 Other postprocedural complications and disorders of digestive system: Secondary | ICD-10-CM | POA: Diagnosis not present

## 2019-03-05 DIAGNOSIS — N800 Endometriosis of uterus: Secondary | ICD-10-CM | POA: Diagnosis present

## 2019-03-05 DIAGNOSIS — D270 Benign neoplasm of right ovary: Secondary | ICD-10-CM | POA: Diagnosis not present

## 2019-03-05 DIAGNOSIS — N72 Inflammatory disease of cervix uteri: Secondary | ICD-10-CM | POA: Diagnosis not present

## 2019-03-05 DIAGNOSIS — R1900 Intra-abdominal and pelvic swelling, mass and lump, unspecified site: Secondary | ICD-10-CM | POA: Diagnosis present

## 2019-03-05 DIAGNOSIS — D282 Benign neoplasm of uterine tubes and ligaments: Secondary | ICD-10-CM | POA: Diagnosis not present

## 2019-03-05 DIAGNOSIS — E785 Hyperlipidemia, unspecified: Secondary | ICD-10-CM | POA: Diagnosis not present

## 2019-03-05 DIAGNOSIS — I10 Essential (primary) hypertension: Secondary | ICD-10-CM | POA: Diagnosis present

## 2019-03-05 DIAGNOSIS — D367 Benign neoplasm of other specified sites: Secondary | ICD-10-CM | POA: Diagnosis not present

## 2019-03-05 DIAGNOSIS — K219 Gastro-esophageal reflux disease without esophagitis: Secondary | ICD-10-CM | POA: Diagnosis not present

## 2019-03-05 DIAGNOSIS — D259 Leiomyoma of uterus, unspecified: Secondary | ICD-10-CM | POA: Diagnosis not present

## 2019-03-05 DIAGNOSIS — D271 Benign neoplasm of left ovary: Secondary | ICD-10-CM | POA: Diagnosis not present

## 2019-03-05 DIAGNOSIS — R1032 Left lower quadrant pain: Secondary | ICD-10-CM | POA: Diagnosis not present

## 2019-03-05 DIAGNOSIS — K567 Ileus, unspecified: Secondary | ICD-10-CM | POA: Diagnosis not present

## 2019-03-05 DIAGNOSIS — Z801 Family history of malignant neoplasm of trachea, bronchus and lung: Secondary | ICD-10-CM

## 2019-03-05 DIAGNOSIS — D251 Intramural leiomyoma of uterus: Secondary | ICD-10-CM | POA: Diagnosis not present

## 2019-03-05 DIAGNOSIS — Z800 Family history of malignant neoplasm of digestive organs: Secondary | ICD-10-CM | POA: Diagnosis not present

## 2019-03-05 DIAGNOSIS — R1011 Right upper quadrant pain: Secondary | ICD-10-CM

## 2019-03-05 DIAGNOSIS — N840 Polyp of corpus uteri: Secondary | ICD-10-CM | POA: Diagnosis not present

## 2019-03-05 DIAGNOSIS — R1907 Generalized intra-abdominal and pelvic swelling, mass and lump: Secondary | ICD-10-CM | POA: Diagnosis not present

## 2019-03-05 DIAGNOSIS — R9389 Abnormal findings on diagnostic imaging of other specified body structures: Secondary | ICD-10-CM | POA: Diagnosis not present

## 2019-03-05 HISTORY — PX: ABDOMINAL HYSTERECTOMY: SHX81

## 2019-03-05 HISTORY — PX: CYSTOSCOPY: SHX5120

## 2019-03-05 LAB — POCT PREGNANCY, URINE: Preg Test, Ur: NEGATIVE

## 2019-03-05 SURGERY — HYSTERECTOMY, ABDOMINAL
Anesthesia: General | Site: Bladder

## 2019-03-05 MED ORDER — OXYCODONE-ACETAMINOPHEN 5-325 MG PO TABS
1.0000 | ORAL_TABLET | ORAL | Status: DC | PRN
  Administered 2019-03-05 – 2019-03-08 (×7): 2 via ORAL
  Filled 2019-03-05 (×7): qty 2

## 2019-03-05 MED ORDER — SODIUM CHLORIDE FLUSH 0.9 % IV SOLN
INTRAVENOUS | Status: AC
  Filled 2019-03-05: qty 10

## 2019-03-05 MED ORDER — ONDANSETRON HCL 4 MG/2ML IJ SOLN
INTRAMUSCULAR | Status: DC | PRN
  Administered 2019-03-05: 4 mg via INTRAVENOUS

## 2019-03-05 MED ORDER — SODIUM CHLORIDE 0.9% FLUSH: 9.0000 mL | INTRAVENOUS | Status: DC | PRN

## 2019-03-05 MED ORDER — KETOROLAC TROMETHAMINE 30 MG/ML IJ SOLN
30.0000 mg | Freq: Once | INTRAMUSCULAR | Status: AC
  Administered 2019-03-05: 30 mg via INTRAVENOUS

## 2019-03-05 MED ORDER — 0.9 % SODIUM CHLORIDE (POUR BTL) OPTIME
TOPICAL | Status: DC | PRN
  Administered 2019-03-05: 2000 mL

## 2019-03-05 MED ORDER — KETOROLAC TROMETHAMINE 30 MG/ML IJ SOLN
30.0000 mg | Freq: Four times a day (QID) | INTRAMUSCULAR | Status: AC
  Administered 2019-03-05 – 2019-03-06 (×3): 30 mg via INTRAVENOUS
  Filled 2019-03-05 (×5): qty 1

## 2019-03-05 MED ORDER — SIMETHICONE 80 MG PO CHEW: 80.0000 mg | CHEWABLE_TABLET | Freq: Four times a day (QID) | ORAL | Status: DC | PRN

## 2019-03-05 MED ORDER — PROPOFOL 10 MG/ML IV BOLUS
INTRAVENOUS | Status: DC | PRN
  Administered 2019-03-05: 150 mg via INTRAVENOUS

## 2019-03-05 MED ORDER — FENTANYL CITRATE (PF) 100 MCG/2ML IJ SOLN
25.0000 ug | INTRAMUSCULAR | Status: DC | PRN
  Administered 2019-03-05 (×2): 25 ug via INTRAVENOUS

## 2019-03-05 MED ORDER — DIPHENHYDRAMINE HCL 12.5 MG/5ML PO ELIX: 12.5000 mg | ORAL_SOLUTION | Freq: Four times a day (QID) | ORAL | Status: DC | PRN

## 2019-03-05 MED ORDER — MIDAZOLAM HCL 2 MG/2ML IJ SOLN
INTRAMUSCULAR | Status: AC
  Filled 2019-03-05: qty 2

## 2019-03-05 MED ORDER — DEXMEDETOMIDINE HCL IN NACL 200 MCG/50ML IV SOLN
INTRAVENOUS | Status: DC | PRN
  Administered 2019-03-05: 12 ug via INTRAVENOUS
  Administered 2019-03-05: 4 ug via INTRAVENOUS
  Administered 2019-03-05 (×2): 8 ug via INTRAVENOUS

## 2019-03-05 MED ORDER — SOD CITRATE-CITRIC ACID 500-334 MG/5ML PO SOLN
30.0000 mL | ORAL | Status: AC
  Administered 2019-03-05: 30 mL via ORAL
  Filled 2019-03-05 (×2): qty 30

## 2019-03-05 MED ORDER — OXYCODONE HCL 5 MG PO TABS: 5.0000 mg | ORAL_TABLET | Freq: Once | ORAL | Status: DC | PRN

## 2019-03-05 MED ORDER — FENTANYL CITRATE (PF) 250 MCG/5ML IJ SOLN
INTRAMUSCULAR | Status: DC | PRN
  Administered 2019-03-05: 50 ug via INTRAVENOUS
  Administered 2019-03-05: 100 ug via INTRAVENOUS

## 2019-03-05 MED ORDER — CEFAZOLIN SODIUM-DEXTROSE 2-4 GM/100ML-% IV SOLN
2.0000 g | INTRAVENOUS | Status: AC
  Administered 2019-03-05: 08:00:00 2 g via INTRAVENOUS
  Filled 2019-03-05: qty 100

## 2019-03-05 MED ORDER — ONDANSETRON HCL 4 MG/2ML IJ SOLN
4.0000 mg | Freq: Once | INTRAMUSCULAR | Status: AC | PRN
  Administered 2019-03-05: 4 mg via INTRAVENOUS

## 2019-03-05 MED ORDER — KETOROLAC TROMETHAMINE 30 MG/ML IJ SOLN
INTRAMUSCULAR | Status: AC
  Filled 2019-03-05: qty 1

## 2019-03-05 MED ORDER — FENTANYL CITRATE (PF) 100 MCG/2ML IJ SOLN
INTRAMUSCULAR | Status: AC
  Filled 2019-03-05: qty 2

## 2019-03-05 MED ORDER — LIDOCAINE 2% (20 MG/ML) 5 ML SYRINGE
INTRAMUSCULAR | Status: DC | PRN
  Administered 2019-03-05: 40 mg via INTRAVENOUS

## 2019-03-05 MED ORDER — SUGAMMADEX SODIUM 200 MG/2ML IV SOLN
INTRAVENOUS | Status: DC | PRN
  Administered 2019-03-05: 150 mg via INTRAVENOUS

## 2019-03-05 MED ORDER — IBUPROFEN 800 MG PO TABS
800.0000 mg | ORAL_TABLET | Freq: Four times a day (QID) | ORAL | Status: DC
  Administered 2019-03-07 – 2019-03-08 (×6): 800 mg via ORAL
  Filled 2019-03-05 (×7): qty 1

## 2019-03-05 MED ORDER — BUPIVACAINE HCL (PF) 0.25 % IJ SOLN
INTRAMUSCULAR | Status: AC
  Filled 2019-03-05: qty 30

## 2019-03-05 MED ORDER — MIDAZOLAM HCL 2 MG/2ML IJ SOLN
INTRAMUSCULAR | Status: DC | PRN
  Administered 2019-03-05: 2 mg via INTRAVENOUS

## 2019-03-05 MED ORDER — DIPHENHYDRAMINE HCL 50 MG/ML IJ SOLN: 12.5000 mg | Freq: Four times a day (QID) | INTRAMUSCULAR | Status: DC | PRN

## 2019-03-05 MED ORDER — METHYLENE BLUE 0.5 % INJ SOLN
INTRAVENOUS | Status: DC | PRN
  Administered 2019-03-05: 5 mg via INTRAVENOUS

## 2019-03-05 MED ORDER — ROCURONIUM BROMIDE 10 MG/ML (PF) SYRINGE
PREFILLED_SYRINGE | INTRAVENOUS | Status: DC | PRN
  Administered 2019-03-05: 50 mg via INTRAVENOUS
  Administered 2019-03-05: 20 mg via INTRAVENOUS

## 2019-03-05 MED ORDER — METOPROLOL SUCCINATE ER 100 MG PO TB24
200.0000 mg | ORAL_TABLET | Freq: Every day | ORAL | Status: DC
  Administered 2019-03-06 – 2019-03-09 (×4): 200 mg via ORAL
  Filled 2019-03-05 (×4): qty 2

## 2019-03-05 MED ORDER — FAMOTIDINE 20 MG PO TABS
20.0000 mg | ORAL_TABLET | Freq: Two times a day (BID) | ORAL | Status: DC
  Administered 2019-03-05 – 2019-03-09 (×8): 20 mg via ORAL
  Filled 2019-03-05 (×8): qty 1

## 2019-03-05 MED ORDER — ONDANSETRON HCL 4 MG/2ML IJ SOLN
INTRAMUSCULAR | Status: AC
  Filled 2019-03-05: qty 2

## 2019-03-05 MED ORDER — PROPOFOL 10 MG/ML IV BOLUS
INTRAVENOUS | Status: AC
  Filled 2019-03-05: qty 40

## 2019-03-05 MED ORDER — CHLORHEXIDINE GLUCONATE CLOTH 2 % EX PADS
6.0000 | MEDICATED_PAD | Freq: Every day | CUTANEOUS | Status: DC
  Administered 2019-03-05 – 2019-03-06 (×2): 6 via TOPICAL

## 2019-03-05 MED ORDER — OXYCODONE HCL 5 MG/5ML PO SOLN: 5.0000 mg | Freq: Once | ORAL | Status: DC | PRN

## 2019-03-05 MED ORDER — DEXAMETHASONE SODIUM PHOSPHATE 10 MG/ML IJ SOLN
INTRAMUSCULAR | Status: DC | PRN
  Administered 2019-03-05: 10 mg via INTRAVENOUS

## 2019-03-05 MED ORDER — DOCUSATE SODIUM 100 MG PO CAPS
100.0000 mg | ORAL_CAPSULE | Freq: Two times a day (BID) | ORAL | Status: DC
  Administered 2019-03-05 – 2019-03-09 (×6): 100 mg via ORAL
  Filled 2019-03-05 (×9): qty 1

## 2019-03-05 MED ORDER — LACTATED RINGERS IV SOLN: INTRAVENOUS | Status: DC

## 2019-03-05 MED ORDER — BUPIVACAINE HCL (PF) 0.25 % IJ SOLN: INTRAMUSCULAR | Status: DC | PRN

## 2019-03-05 MED ORDER — ONDANSETRON HCL 4 MG/2ML IJ SOLN: 4.0000 mg | Freq: Four times a day (QID) | INTRAMUSCULAR | Status: DC | PRN

## 2019-03-05 MED ORDER — ESCITALOPRAM OXALATE 10 MG PO TABS
10.0000 mg | ORAL_TABLET | Freq: Every day | ORAL | Status: DC
  Administered 2019-03-06 – 2019-03-09 (×4): 10 mg via ORAL
  Filled 2019-03-05 (×4): qty 1

## 2019-03-05 MED ORDER — HYDROMORPHONE 1 MG/ML IV SOLN
INTRAVENOUS | Status: DC
  Administered 2019-03-05: 30 mg via INTRAVENOUS
  Administered 2019-03-05 (×2): 0.4 mg via INTRAVENOUS
  Administered 2019-03-05: 0.8 mg via INTRAVENOUS
  Administered 2019-03-06: 1.25 mg via INTRAVENOUS
  Administered 2019-03-06: 0.4 mg via INTRAVENOUS
  Filled 2019-03-05: qty 30

## 2019-03-05 MED ORDER — FENTANYL CITRATE (PF) 250 MCG/5ML IJ SOLN
INTRAMUSCULAR | Status: AC
  Filled 2019-03-05: qty 5

## 2019-03-05 MED ORDER — HYDRALAZINE HCL 20 MG/ML IJ SOLN
INTRAMUSCULAR | Status: DC | PRN
  Administered 2019-03-05: 5 mg via INTRAVENOUS

## 2019-03-05 MED ORDER — NALOXONE HCL 0.4 MG/ML IJ SOLN: 0.4000 mg | INTRAMUSCULAR | Status: DC | PRN

## 2019-03-05 MED ORDER — SCOPOLAMINE 1 MG/3DAYS TD PT72
MEDICATED_PATCH | TRANSDERMAL | Status: DC | PRN
  Administered 2019-03-05: 1 via TRANSDERMAL

## 2019-03-05 MED ORDER — METHYLENE BLUE 0.5 % INJ SOLN
INTRAVENOUS | Status: AC
  Filled 2019-03-05: qty 10

## 2019-03-05 SURGICAL SUPPLY — 51 items
CANISTER SUCT 3000ML PPV (MISCELLANEOUS) ×3 IMPLANT
COVER WAND RF STERILE (DRAPES) IMPLANT
DECANTER SPIKE VIAL GLASS SM (MISCELLANEOUS) IMPLANT
DERMABOND ADVANCED (GAUZE/BANDAGES/DRESSINGS) ×1
DERMABOND ADVANCED .7 DNX12 (GAUZE/BANDAGES/DRESSINGS) ×2 IMPLANT
DRAPE WARM FLUID 44X44 (DRAPES) ×3 IMPLANT
DRSG OPSITE POSTOP 4X10 (GAUZE/BANDAGES/DRESSINGS) IMPLANT
DURAPREP 26ML APPLICATOR (WOUND CARE) ×3 IMPLANT
ELECT BLADE 4.0 EZ CLEAN MEGAD (MISCELLANEOUS) ×3
ELECTRODE BLDE 4.0 EZ CLN MEGD (MISCELLANEOUS) ×2 IMPLANT
GAUZE 4X4 16PLY RFD (DISPOSABLE) IMPLANT
GLOVE BIO SURGEON STRL SZ 6 (GLOVE) ×3 IMPLANT
GLOVE BIO SURGEON STRL SZ 6.5 (GLOVE) ×3 IMPLANT
GLOVE BIOGEL PI IND STRL 6.5 (GLOVE) ×2 IMPLANT
GLOVE BIOGEL PI IND STRL 7.0 (GLOVE) ×2 IMPLANT
GLOVE BIOGEL PI INDICATOR 6.5 (GLOVE) ×1
GLOVE BIOGEL PI INDICATOR 7.0 (GLOVE) ×1
GLOVE NEODERM STRL 7.5 LF PF (GLOVE) ×2 IMPLANT
GLOVE SURG NEODERM 7.5  LF PF (GLOVE) ×1
GOWN STRL REUS W/ TWL LRG LVL3 (GOWN DISPOSABLE) ×8 IMPLANT
GOWN STRL REUS W/TWL LRG LVL3 (GOWN DISPOSABLE) ×4
HIBICLENS CHG 4% 4OZ BTL (MISCELLANEOUS) ×3 IMPLANT
KIT TURNOVER KIT B (KITS) ×3 IMPLANT
LIGASURE IMPACT 36 18CM CVD LR (INSTRUMENTS) IMPLANT
NEEDLE HYPO 22GX1.5 SAFETY (NEEDLE) ×3 IMPLANT
NS IRRIG 1000ML POUR BTL (IV SOLUTION) ×6 IMPLANT
PACK ABDOMINAL GYN (CUSTOM PROCEDURE TRAY) ×3 IMPLANT
PAD OB MATERNITY 4.3X12.25 (PERSONAL CARE ITEMS) ×3 IMPLANT
PENCIL SMOKE EVACUATOR (MISCELLANEOUS) ×3 IMPLANT
RTRCTR C-SECT PINK 25CM LRG (MISCELLANEOUS) ×3 IMPLANT
SET CYSTO W/LG BORE CLAMP LF (SET/KITS/TRAYS/PACK) ×3 IMPLANT
SHEET LAVH (DRAPES) ×3 IMPLANT
SLEEVE SUCTION 125 (MISCELLANEOUS) ×3 IMPLANT
SPONGE LAP 18X18 RF (DISPOSABLE) ×3 IMPLANT
STAPLER VISISTAT 35W (STAPLE) IMPLANT
SUT PDS AB 0 CTX 60 (SUTURE) ×3 IMPLANT
SUT PLAIN 2 0 XLH (SUTURE) IMPLANT
SUT VIC AB 0 CT1 27 (SUTURE) ×5
SUT VIC AB 0 CT1 27XBRD ANBCTR (SUTURE) ×10 IMPLANT
SUT VIC AB 1 CT1 18XBRD ANBCTR (SUTURE) ×6 IMPLANT
SUT VIC AB 1 CT1 8-18 (SUTURE) ×3
SUT VIC AB 2-0 CT1 27 (SUTURE) ×1
SUT VIC AB 2-0 CT1 TAPERPNT 27 (SUTURE) ×2 IMPLANT
SUT VIC AB 3-0 SH 27 (SUTURE)
SUT VIC AB 3-0 SH 27X BRD (SUTURE) IMPLANT
SUT VIC AB 4-0 KS 27 (SUTURE) ×3 IMPLANT
SUT VICRYL 0 TIES 12 18 (SUTURE) IMPLANT
SYR 10ML LL (SYRINGE) ×3 IMPLANT
SYR CONTROL 10ML LL (SYRINGE) ×3 IMPLANT
TOWEL GREEN STERILE FF (TOWEL DISPOSABLE) ×6 IMPLANT
TRAY FOLEY W/BAG SLVR 14FR (SET/KITS/TRAYS/PACK) ×3 IMPLANT

## 2019-03-05 NOTE — Progress Notes (Signed)
Patient seen and examined at bedside.  Patient is tolerating sips of liquid without nausea or vomiting.  Pain is well controlled on IV medication.  Patient is not trying to move around too much.  Educated on incentive spirometry and pain goal expectations.  Foley catheter still in place.  Abdomen is soft with low vertical incision appearing clean dry and intact.  BP 132/82 (BP Location: Left Arm)   Pulse 69   Temp 98.4 F (36.9 C) (Oral)   Resp 17   Ht 5' (1.524 m)   Wt 60.8 kg   SpO2 95%   BMI 26.17 kg/m   AM CBC, likely discharge POD#2.  Discussed ambulation, p.o. intake, voiding, passage of flatus, pain control expectations.

## 2019-03-05 NOTE — Transfer of Care (Signed)
Immediate Anesthesia Transfer of Care Note  Patient: Debbie Hill  Procedure(s) Performed: HYSTERECTOMY ABDOMINAL, BILATERAL SALPINGO-OOPHORECTOMY , AND PELVIC MASS REMOVAL (Bilateral Abdomen) CYSTOSCOPY (N/A Bladder)  Patient Location: PACU  Anesthesia Type:General  Level of Consciousness: drowsy and patient cooperative  Airway & Oxygen Therapy: Patient Spontanous Breathing  Post-op Assessment: Report given to RN, Post -op Vital signs reviewed and stable and Patient moving all extremities X 4  Post vital signs: Reviewed and stable  Last Vitals:  Vitals Value Taken Time  BP 139/82 03/05/19 1006  Temp    Pulse 79 03/05/19 1007  Resp 19 03/05/19 1007  SpO2 96 % 03/05/19 1007  Vitals shown include unvalidated device data.  Last Pain:  Vitals:   03/05/19 0618  TempSrc:   PainSc: 0-No pain      Patients Stated Pain Goal: 4 ([id number])  Complications: No apparent anesthesia complications

## 2019-03-05 NOTE — Anesthesia Procedure Notes (Signed)
Procedure Name: Intubation Date/Time: 03/05/2019 7:38 AM Performed by: Larene Beach, CRNA Pre-anesthesia Checklist: Patient identified, Emergency Drugs available, Suction available and Patient being monitored Patient Re-evaluated:Patient Re-evaluated prior to induction Oxygen Delivery Method: Circle system utilized Preoxygenation: Pre-oxygenation with 100% oxygen Induction Type: IV induction Ventilation: Mask ventilation without difficulty Laryngoscope Size: Mac and 3 Grade View: Grade I Tube type: Oral Tube size: 7.0 mm Number of attempts: 1 Airway Equipment and Method: Stylet and Oral airway Placement Confirmation: ETT inserted through vocal cords under direct vision,  positive ETCO2 and breath sounds checked- equal and bilateral Secured at: 21 cm Tube secured with: Tape Dental Injury: Teeth and Oropharynx as per pre-operative assessment

## 2019-03-05 NOTE — Progress Notes (Signed)
Received patient from PACU. Patient alert and oriented. Dressing clean, dry, and intact. Patient oriented to call bell and bed controls. Instructed patient not to self ambulate and to utilize call bell for assistance. Will continue to monitor.  

## 2019-03-05 NOTE — Anesthesia Postprocedure Evaluation (Signed)
Anesthesia Post Note  Patient: Debbie Hill  Procedure(s) Performed: HYSTERECTOMY ABDOMINAL, BILATERAL SALPINGO-OOPHORECTOMY , AND PELVIC MASS REMOVAL (Bilateral Abdomen) CYSTOSCOPY (N/A Bladder)     Patient location during evaluation: PACU Anesthesia Type: General Level of consciousness: awake and alert Pain management: pain level controlled Vital Signs Assessment: post-procedure vital signs reviewed and stable Respiratory status: spontaneous breathing, nonlabored ventilation, respiratory function stable and patient connected to nasal cannula oxygen Cardiovascular status: blood pressure returned to baseline and stable Postop Assessment: no apparent nausea or vomiting Anesthetic complications: no    Last Vitals:  Vitals:   03/05/19 1455 03/05/19 1533  BP:  132/82  Pulse:  69  Resp: 16 17  Temp:  36.9 C  SpO2: 94% 92%    Last Pain:  Vitals:   03/05/19 1533  TempSrc: Oral  PainSc:                  [name] COKER

## 2019-03-05 NOTE — Op Note (Addendum)
Date: 03/05/2019 Surgeon: Eula Flax, MD Assistant: Paula Compton, MD Procedure; Total abdominal hysterectomy with bilateral salpingo-oophorectomy, cystoscopy and resection of left pelvic mass Indications: Pelvic pain, cystic mass on preop imaging Anesthesia: GETA IVF: 1300cc LR EBL: 150cc UOP: 250cc Findings: Upon entry into abdominal cavity, large smooth-walled cystic mass was present.  During amputation of cystic mass, rupture was noted and clear serous to somewhat mucinous fluid was drained.  Multilocular cystic mass was then amputated and passed off the table.  Uterus itself appeared within normal limits.  Bilateral ovaries appeared within normal limits; pelvic mass appeared to originate from left fimbriae.  Appendix appeared within normal limits. CDS, diaphragm without evidence of disease Pathology: Uterus, cervix, bilateral ovaries, bilateral fallopian tubes.  Cystic pelvic mass.  Abdominal washings  Debbie Hill is a 58yo [id number] PMF who presented for evaluation of pelvic pain and pressure symptoms. Found to have palpable cystic pelvic mass that was visualized on TVUS with a thickened EMS.  Pelvic mass measured 18 x 10 x 14 cm with largest cystic area measuring 9 cm.  Uterus itself was 7 cm in largest diameter.  Preop endometrial biopsy was negative for hyperplasia and malignancy. Preoperative ovarian tumor markers were overall WNL.  Patient elected for surgical management and was consented for TAH/BSO cystoscopy.  Surgical Risk: Risks of TAH include infection of the uterus, pelvic organs, or skin, inadvertent injury to internal organs, such as bowel or bladder. If there is major injury, extensive surgery may be required. If injury is minor, it may be treated with relative ease. Discussed possibility of excessive blood loss and transfusion. Patient aware that no future fertility will remain after procedure. Patient accepts the possibility of blood transfusion, if necessary. Bowel and/or  bladder injury may require prolonged inpatient stay and possible colostomy, Foley catheter, etc, as deemed fit by other surgeon. Patient understands and agrees to move forward with surgery.  Operative Technique: The patient was taken to the operating room where adequate anesthesia was established.  Patient was prepped and draped in the normal fashion.  Foley catheter was placed prior to draping.  Per ACOG recommendations, 2 g Ancef was given preoperatively.  A low vertical incision was made using scalpel followed by Bovie cautery um to umbilicus.  The incision was taken down to the fascial level.  The fascia was nicked and carefully entered using tonsils to tent upwards away from rectus bellies.  The rectus muscles were separated and taken down using Bovie cautery.  The peritoneal cavity was entered with the use of Kelly clamps x2 and Metzenbaum scissors.  Findings were then noted as above.  Alexis retractor was placed without issue taking care to avoid any bowel entrapment.  Abdominal washings were performed at this time due to large nature of pelvic mass.  Lap sponges were placed in order to retract the bowel.  Pelvic mass was carefully lifted up from posterior cul-de-sac and delivered through the incision.  Pedicle was noted to originate from inferior portion of left fimbriae.  This was amputated with a Kelly clamp crossclamped x2 and fixation suture.  During passing of specimen rupture occurred of cystic contents as noted above.  Attention was then turned to her right round ligament.  Bovie cautery used to open leaves of broad ligament anteriorly and posteriorly.  The right IP ligament was isolated; window created and vessels were suture-ligated using 0 Vicryl pop-off.  Ureter was visualized below clamp prior to ligation.  This process was repeated on patient's left.  Anterior leaves  of broad ligament were then taken down bilaterally to create a bladder flap.  Uterine arteries were then skeletonized  bilaterally using Bovie cautery.  Heaney clamps x2 were placed and uterine arteries were ligated with good hemostasis noted.  A subsequent cut-clamp-suture method was used using straight Heaney Ballantine's until the cervicovaginal junction was reached.  2 curved Heaney clamps were then placed across cervicovaginal junction and uterus with cervix was then amputated and specimen passed off the field.  After suture ligation, figure of eights were placed in a horizontal manner across the vaginal cuff with excellent hemostasis.  At this time attention turned towards cystoscopy.  Blood catheter removed, methylene blue IV given to patient prior to procedure.  During filling, low expansion was noted despite adequate fluid flow.  Instrument was then found to be within vaginal vault rather than within urethra.  Once this was rectified, routine cystoscopy was carried out.  Bilateral efflux noted from ureters.  No suture evident within the bladder.  Bubble sign positive.  Blood catheter replaced.  Attention was then turned back towards vaginal cuff.  After irrigation of abdominal cavity, additional figure-of-eight sutures were placed on cuff to ensure hemostasis.  The bladder was kept away via Deaver retractor.  Lap sponges were removed from abdominal cavity.  Malleable retractor placed over bowel and fascial incision was closed with 0 looped PDS in a running mass closure fashion.  Surgeon's finger was placed along incision to ensure no bowel entrapment and tight hold.  Subcutaneous layer was closed with 2-0 Vicryl in running fashion.  The skin was closed with subcuticular stitch running using a Keith needle.  Dermabond was placed over top of incision.  All counts are correct at end of procedure.  Patient tolerated procedure well.

## 2019-03-05 NOTE — H&P (Signed)
Debbie Hill is an 58 y.o. female. Presents for scheduled surgery. Noted some light VB beginning Saturday evening. Denies N/V, F/C. Still has feeling of fullness in pelvis and increased urinary frequency, unchanged since visit in October.   Pertinent Gynecological History: Menses: post-menopausal Blood transfusions: none Sexually transmitted diseases: no past history Previous GYN Procedures: none  Last mammogram: normal Date: 05/2018 (benign cystic changes) Last pap: normal Date: 05/2016 OB History: G2, P2002     Past Medical History:  Diagnosis Date  . Chronic headaches   . Depression   . GERD (gastroesophageal reflux disease)   . Hypertension     Past Surgical History:  Procedure Laterality Date  . LASIK Bilateral 2012    Family History  Problem Relation Age of Onset  . Cancer Mother        Lung Cancer  . Hypertension Mother   . Cancer Father        Lung Cancer  . Hypertension Sister   . Depression Sister   . Hypertension Paternal Grandmother   . Cancer Paternal Grandfather        Esophagus Cancer  . Breast cancer Neg Hx     Social History:  reports that she has never smoked. She has never used smokeless tobacco. She reports current alcohol use. She reports that she does not use drugs.  Allergies: No Known Allergies  Medications Prior to Admission  Medication Sig Dispense Refill Last Dose  . aspirin-acetaminophen-caffeine (EXCEDRIN MIGRAINE) 250-250-65 MG per tablet Take 1 tablet by mouth daily as needed for headache or migraine.   Past Month at Unknown time  . dexlansoprazole (DEXILANT) 60 MG capsule Take 60 mg by mouth daily.   03/05/2019 at 0400  . escitalopram (LEXAPRO) 10 MG tablet TAKE 1 TABLET DAILY 90 tablet 3 03/05/2019 at 0400  . famotidine (PEPCID) 20 MG tablet Take 20 mg by mouth 2 (two) times daily.    03/05/2019 at 0400  . metoprolol (TOPROL-XL) 200 MG 24 hr tablet Take 1 tablet (200 mg total) by mouth daily. Take with or immediately following a meal. 90  tablet 3 03/05/2019 at 0400  . cholecalciferol (VITAMIN D) 1000 units tablet Take 1,000 Units daily by mouth.   More than a month at Unknown time  . Multiple Vitamins-Minerals (MULTIVITAMIN PO) Take 1 tablet by mouth daily.   More than a month at Unknown time    Review of Systems  Constitutional: Negative for appetite change, chills, diaphoresis and fever.  Respiratory: Negative for shortness of breath.   Cardiovascular: Negative for chest pain, palpitations and leg swelling.  Gastrointestinal: Negative for abdominal distention, abdominal pain, nausea and vomiting.  Genitourinary: Positive for frequency, pelvic pain and vaginal bleeding.  Musculoskeletal: Negative for back pain.  Skin: Negative for pallor.  Neurological: Negative for dizziness, weakness and headaches.  Psychiatric/Behavioral: Negative for suicidal ideas. The patient is nervous/anxious.     Blood pressure (!) 159/100, pulse 76, temperature 98.2 F (36.8 C), temperature source Oral, resp. rate 18, height 5' (1.524 m), weight 60.8 kg, SpO2 99 %. Physical Exam  Constitutional: She is oriented to person, place, and time. She appears well-developed and well-nourished. No distress.  HENT:  Head: Normocephalic and atraumatic.  Eyes: Pupils are equal, round, and reactive to light.  Cardiovascular: Normal rate and regular rhythm. Exam reveals no gallop.  No murmur heard. Respiratory: She has no wheezes. She has no rales.  GI: There is abdominal tenderness. There is guarding (along lower abdomen, present since first visit but  still soft). There is no rebound.  Musculoskeletal:        General: Normal range of motion.     Cervical back: Normal range of motion and neck supple.  Neurological: She is alert and oriented to person, place, and time.  Skin: Skin is warm and dry.    Results for orders placed or performed during the hospital encounter of 03/05/19 (from the past 24 hour(s))  Pregnancy, urine POC     Status: None    Collection Time: 03/05/19  5:58 AM  Result Value Ref Range   Preg Test, Ur NEGATIVE NEGATIVE    No results found.  Assessment/Plan: This is a 58yo [id number] presenting for scheduled TAH/BSO/cysto. Risks of TAH include infection of the uterus, pelvic organs, or skin, inadvertent injury to internal organs, such as bowel or bladder. If there is major injury, extensive surgery may be required. If injury is minor, it may be treated with relative ease. Discussed possibility of excessive blood loss and transfusion. Patient aware that no future fertility will remain after procedure. Patient accepts the possibility of blood transfusion, if necessary. Bowel and/or bladder injury may require prolonged inpatient stay and possible colostomy, Foley catheter, etc, as deemed fit by other surgeon. Patient understands and agrees to move forward with surgery.  Melida Quitter Clearance Chenault 03/05/2019, 7:15 AM

## 2019-03-05 NOTE — Anesthesia Preprocedure Evaluation (Signed)
Anesthesia Evaluation  Patient identified by MRN, date of birth, ID band Patient awake    Reviewed: Allergy & Precautions, NPO status , Patient's Chart, lab work & pertinent test results  Airway Mallampati: II  TM Distance: >3 FB Neck ROM: Full    Dental  (+) Teeth Intact, Dental Advisory Given   Pulmonary    breath sounds clear to auscultation       Cardiovascular hypertension,  Rhythm:Regular Rate:Normal     Neuro/Psych    GI/Hepatic   Endo/Other    Renal/GU      Musculoskeletal   Abdominal   Peds  Hematology   Anesthesia Other Findings   Reproductive/Obstetrics                             Anesthesia Physical Anesthesia Plan  ASA: III  Anesthesia Plan: General   Post-op Pain Management:    Induction: Intravenous  PONV Risk Score and Plan: Ondansetron and Dexamethasone  Airway Management Planned: Oral ETT  Additional Equipment:   Intra-op Plan:   Post-operative Plan: Extubation in OR  Informed Consent: I have reviewed the patients History and Physical, chart, labs and discussed the procedure including the risks, benefits and alternatives for the proposed anesthesia with the patient or authorized representative who has indicated his/her understanding and acceptance.     Dental advisory given  Plan Discussed with: CRNA and Anesthesiologist  Anesthesia Plan Comments: (htn Anxiety  Plan GA with oral ETT)        Anesthesia Quick Evaluation

## 2019-03-06 LAB — CBC
HCT: 39.7 % (ref 36.0–46.0)
Hemoglobin: 13.4 g/dL (ref 12.0–15.0)
MCH: 28.5 pg (ref 26.0–34.0)
MCHC: 33.8 g/dL (ref 30.0–36.0)
MCV: 84.3 fL (ref 80.0–100.0)
Platelets: 258 10*3/uL (ref 150–400)
RBC: 4.71 MIL/uL (ref 3.87–5.11)
RDW: 13.8 % (ref 11.5–15.5)
WBC: 14.5 10*3/uL — ABNORMAL HIGH (ref 4.0–10.5)
nRBC: 0 % (ref 0.0–0.2)

## 2019-03-06 LAB — CYTOLOGY - NON PAP

## 2019-03-06 LAB — SURGICAL PATHOLOGY

## 2019-03-06 MED ORDER — SIMETHICONE 80 MG PO CHEW
80.0000 mg | CHEWABLE_TABLET | Freq: Four times a day (QID) | ORAL | Status: DC
  Administered 2019-03-06 – 2019-03-09 (×12): 80 mg via ORAL
  Filled 2019-03-06 (×12): qty 1

## 2019-03-06 MED ORDER — MAGNESIUM HYDROXIDE 400 MG/5ML PO SUSP
15.0000 mL | Freq: Two times a day (BID) | ORAL | Status: AC
  Administered 2019-03-06 – 2019-03-07 (×2): 15 mL via ORAL
  Filled 2019-03-06 (×2): qty 30

## 2019-03-06 NOTE — Progress Notes (Signed)
POD #1  Subjective:  No acute events overnight.  Pt has not yet truly ambulated or voided 2/2 Foley, no issues with po intake.  She denies nausea or vomiting.  Pain is well controlled w/ PCA; thinks she feels bloated and may be having sharp gas pains.  She has not had flatus. She has not had bowel movement.   Reviewed intraoperative course and goals for POD#1  Objective: Blood pressure 132/89, pulse 62, temperature 98.8 F (37.1 C), temperature source Oral, resp. rate 19, height 5' (1.524 m), weight 60.8 kg, SpO2 97 %.  Physical Exam:  General: alert, cooperative and no distress Lochia:normal flow Chest: CTAB Heart: RRR no m/r/g Abdomen: +BSx4, soft, appropriately tender for POD#1. Lovertical incision with intact suture, C/D/I. Mild erythema cm surrounding, however NTTP, without induration, Minimal distention in epigastrium. Extremities: neg edema, neg calf TTP BL, neg Homans BL  Recent Labs    03/06/19 0235  HGB 13.4  HCT 39.7   UOP overnight 1400cc  Assessment/Plan:  ASSESSMENT: Debbie Hill is a 58 y.o. [id number] s/p TAH/BSO/cysto for pelvic mass in PMF. PMHx s/f HTN, depression, GERD. Doing well POD#1  -Continue PO pain meds, DC PCA and IVF -D/C Foley and pend spontaneous void -Encourage ambulation and incentive spirometry -Anticipate DC home POD2-3  -Pending pathology   LOS: 1 day

## 2019-03-06 NOTE — Progress Notes (Signed)
Doing well this evening.  Tolerating p.o. without nausea or vomiting however decreased appetite which is normal.  Ambulating without issue.  Voiding without issue.  Still pending flatus.  Pain well controlled on p.o. medication but increased from baseline.  Explained this is normal for postop day 1.  BP (!) 145/93 (BP Location: Right Arm)   Pulse 61   Temp 98.4 F (36.9 C) (Oral)   Resp 18   Ht 5' (1.524 m)   Wt 60.8 kg   SpO2 95%   BMI 26.17 kg/m  Vision appears clean dry and intact.  Previously noted erythema surrounding is now more pale pink and is still nontender to palpation without induration.  Mild distention still present  Plan to make simethicone scheduled and add milk of mag for bowels  Anticipate DC tomorrow afternoon vs POD#3 AM

## 2019-03-07 MED ORDER — MAGNESIUM HYDROXIDE 400 MG/5ML PO SUSP
15.0000 mL | Freq: Three times a day (TID) | ORAL | Status: DC
  Administered 2019-03-07 – 2019-03-09 (×4): 15 mL via ORAL
  Filled 2019-03-07 (×7): qty 30

## 2019-03-07 MED ORDER — ONDANSETRON 4 MG PO TBDP
4.0000 mg | ORAL_TABLET | Freq: Once | ORAL | Status: AC
  Administered 2019-03-07: 4 mg via ORAL
  Filled 2019-03-07: qty 1

## 2019-03-07 MED ORDER — ONDANSETRON 4 MG PO TBDP: 4.0000 mg | ORAL_TABLET | Freq: Three times a day (TID) | ORAL | Status: DC | PRN

## 2019-03-07 MED ORDER — HYDRALAZINE HCL 20 MG/ML IJ SOLN: 10.0000 mg | INTRAMUSCULAR | Status: DC | PRN

## 2019-03-07 MED ORDER — FLEET ENEMA 7-19 GM/118ML RE ENEM: 1.0000 | ENEMA | Freq: Every day | RECTAL | Status: DC | PRN

## 2019-03-07 MED ORDER — POLYETHYLENE GLYCOL 3350 17 G PO PACK
17.0000 g | PACK | Freq: Every day | ORAL | Status: DC | PRN
  Administered 2019-03-07: 17 g via ORAL
  Filled 2019-03-07: qty 1

## 2019-03-07 NOTE — Plan of Care (Signed)
  Problem: Clinical Measurements: Goal: Ability to maintain clinical measurements within normal limits will improve Outcome: Progressing Goal: Will remain free from infection Outcome: Progressing   

## 2019-03-07 NOTE — Progress Notes (Signed)
Pt's blood pressure went up 202/116 making her MEWS score yellow.  Pt was experiencing nausea and vomiting x1 at the time.  Pt also stated having right lower quadrant spasm.  Denies pain, dizziness.  Dr Terri Piedra was notified.  Per MD to offer pain medicine.  Order for Zofran given. Dr Terri Piedra will be contacting dr Wilhelmenia Blase for further recommendations.  Will continue to monitor.

## 2019-03-07 NOTE — Progress Notes (Signed)
POD #2  Subjective:  No acute events overnight.  Pt voiding and tolerating PO without issue.  Some nausea bu no emesis.  Pain is moderately controlled on PO meds; thinks she feels bloated and may be having sharp gas pains.  She has had flatus. She has not had bowel movement. Feels more bloated than yesterday but states she has been walking a little bit more. Taking 2 percocet at a time. Reviewed use of opiods for pain control and slowing of bowels. Felt improved on scheduled colace and simethicone   Objective: Blood pressure (!) 163/109, pulse 77, temperature 98.7 F (37.1 C), temperature source Oral, resp. rate 18, height 5' (1.524 m), weight 60.8 kg, SpO2 93 %.  Physical Exam:  General: alert, cooperative and no distress Lochia:normal flow Chest: CTAB Heart: RRR no m/r/g Abdomen: +BSx4, soft, appropriately tender. Lovertical incision with intact suture, C/D/I. Mild erythema cm surrounding, however NTTP, without induration. Epigastric distention slightly increased this AM, +tympany Extremities: neg edema, neg calf TTP BL, neg Homans BL  Recent Labs    03/06/19 0235  HGB 13.4  HCT 39.7   UOP overnight 1400cc  Assessment/Plan:  ASSESSMENT: Debbie Hill is a 58 y.o. [id number] s/p TAH/BSO/cysto for pelvic mass in PMF. PMHx s/f HTN, depression, GERD.  -Continue PO pain meds. -Spontaneous ly voiding -Encourage ambulation and incentive spirometry -Scheduled colace/mylicon/MoM -Pathology overall benign:  FINAL MICROSCOPIC DIAGNOSIS:   A. PELVIC MASS, EXCISION:  - Seromucinous cystadenoma of fallopian tube, clinically left.   B. UTERUS, CERVIX AND BILATERAL FALLOPIAN TUBES AND OVARIES,  HYSTERECTOMY AND BILATERAL SALPINGO-OOPHORECTOMY:  Cervix:  - Mild acute and chronic cervicitis.   Uterus:  - Endometrium: Endometrial polyps with breakdown and surface reactive  change.  - Myometrium: Adenomyosis. Leiomyomata.  - Serosa: No significant histopathologic findings.   Adnexa:  -  Right fallopian tube and left fallopian tube tissue: No significant  histopathologic findings.  - Ovaries: Serous cystadenofibromata.  FLUID CYTOLOGY: FINAL MICROSCOPIC DIAGNOSIS:  - Reactive mesothelial cells present   -Like DC home POD#3   LOS: 2 days

## 2019-03-07 NOTE — Progress Notes (Addendum)
Patient ID: Debbie Hill, female   DOB: [birth year], 58 y.o.   MRN: [id number] Late entry  Notified pt had nausea and vomiting x 1 and complaining of pelvic cramping. Pt had declined her afternoon dose of pain medication.  Pts BP 202-204/116-118; HR 76 after vomitus   Plan: I advised give zofran 4mg  odt now and also dose of pain medication ( oxycodone/ibuprofen) and recheck BP afterwards I notified Dr Wilhelmenia Blase as well  I called back about 3 hours later - BP improving, no further nausea/vomiting, Pain relief improving  Informed to let me know if need further intervention from me  [phone]

## 2019-03-08 ENCOUNTER — Encounter (HOSPITAL_COMMUNITY): Payer: Self-pay | Admitting: Obstetrics and Gynecology

## 2019-03-08 ENCOUNTER — Inpatient Hospital Stay (HOSPITAL_COMMUNITY): Payer: BC Managed Care – PPO

## 2019-03-08 LAB — CBC
HCT: 40.9 % (ref 36.0–46.0)
Hemoglobin: 13.6 g/dL (ref 12.0–15.0)
MCH: 27.9 pg (ref 26.0–34.0)
MCHC: 33.3 g/dL (ref 30.0–36.0)
MCV: 83.8 fL (ref 80.0–100.0)
Platelets: 250 10*3/uL (ref 150–400)
RBC: 4.88 MIL/uL (ref 3.87–5.11)
RDW: 13.7 % (ref 11.5–15.5)
WBC: 3.6 10*3/uL — ABNORMAL LOW (ref 4.0–10.5)
nRBC: 0 % (ref 0.0–0.2)

## 2019-03-08 LAB — COMPREHENSIVE METABOLIC PANEL
ALT: 40 U/L (ref 0–44)
AST: 47 U/L — ABNORMAL HIGH (ref 15–41)
Albumin: 2.9 g/dL — ABNORMAL LOW (ref 3.5–5.0)
Alkaline Phosphatase: 90 U/L (ref 38–126)
Anion gap: 6 (ref 5–15)
BUN: 10 mg/dL (ref 6–20)
CO2: 24 mmol/L (ref 22–32)
Calcium: 8.1 mg/dL — ABNORMAL LOW (ref 8.9–10.3)
Chloride: 107 mmol/L (ref 98–111)
Creatinine, Ser: 0.68 mg/dL (ref 0.44–1.00)
GFR calc Af Amer: >60 mL/min (ref >60)
GFR calc non Af Amer: >60 mL/min (ref >60)
Glucose, Bld: 122 mg/dL — ABNORMAL HIGH (ref 70–99)
Potassium: 3.8 mmol/L (ref 3.5–5.1)
Sodium: 137 mmol/L (ref 135–145)
Total Bilirubin: 1.3 mg/dL — ABNORMAL HIGH (ref 0.3–1.2)
Total Protein: 5.6 g/dL — ABNORMAL LOW (ref 6.5–8.1)

## 2019-03-08 MED ORDER — KETOROLAC TROMETHAMINE 30 MG/ML IJ SOLN
30.0000 mg | Freq: Three times a day (TID) | INTRAMUSCULAR | Status: DC | PRN
  Administered 2019-03-08 – 2019-03-09 (×2): 30 mg via INTRAVENOUS
  Filled 2019-03-08 (×2): qty 1

## 2019-03-08 MED ORDER — BISACODYL 10 MG RE SUPP: 10.0000 mg | Freq: Two times a day (BID) | RECTAL | Status: DC | PRN

## 2019-03-08 MED ORDER — LACTATED RINGERS IV SOLN: INTRAVENOUS | Status: DC

## 2019-03-08 MED ORDER — HYDROMORPHONE HCL 1 MG/ML IJ SOLN
0.5000 mg | Freq: Once | INTRAMUSCULAR | Status: AC
  Administered 2019-03-08: 0.5 mg via INTRAVENOUS
  Filled 2019-03-08: qty 1

## 2019-03-08 MED ORDER — BISACODYL 10 MG RE SUPP
10.0000 mg | Freq: Every day | RECTAL | Status: DC | PRN
  Filled 2019-03-08: qty 1

## 2019-03-08 MED ORDER — HYDROMORPHONE HCL 1 MG/ML IJ SOLN: 0.5000 mg | INTRAMUSCULAR | Status: DC | PRN

## 2019-03-08 NOTE — Plan of Care (Signed)
  Problem: Education: Goal: Knowledge of General Education information will improve Description: Including pain rating scale, medication(s)/side effects and non-pharmacologic comfort measures Outcome: Progressing   Problem: Activity: Goal: Risk for activity intolerance will decrease Outcome: Progressing   Problem: Nutrition: Goal: Adequate nutrition will be maintained  Outcome: Not Progressing 

## 2019-03-08 NOTE — Progress Notes (Signed)
Patient ID: Debbie Hill, female   DOB: 14-Aug-1960, 59 y.o.   MRN: [id number]   KUB - small free air, c/w surgery; dilated bowel  Pt made NPO, except some meds Pain control IV  Will give dulcolax to see if any relief T/C NG tube - decompression

## 2019-03-08 NOTE — Progress Notes (Signed)
Spoke w/ on call MD Dr Melba Coon. Patient seen and examined. Still NPO, no nausea at this time, Pain well controlled except for "gas pains" as previously noted  KUB s/f dilated bowel. DDX includes postop ileus, developing obstruction, volvulus. No air-fluid levels noted. Recent postop suggest ileus, esp given large mass removal during open abdominal surgery.  BS x4 but quiet, epigastric distention/tympany  Minimal flatus with dulcolax suppository x1. Reviewed RBA for NGT for decompression with patient, including IV anxiolytic prior as pt is highly anxious. Patient is declining at this time, desires continued ambulation and continued bowel meds. Advised taht, should she change her mind, to notify staff and IV meds can be ordered and NGT placed   Labs overall WNL. AST/ALT  47/40 (ALT is baseline for patient).  Cr WNL. H/H stable at 13.6/40.9, no elevated WBC.   A/P: POD#3 s/p uncomplicated TAH/BSO/Cysto for pelvic mass in PMF. Now w/ postop ileus -Lytes stable, no evidence of infection at this time nor intraab bleed -Continue ambulation. IV NSAIDS already in place, patient educated to use PRN opioids sparingly.  -dulcolax suppository, Miralax, milk of mag all available.  ON-Call MD notified of encounter

## 2019-03-08 NOTE — Progress Notes (Addendum)
3 Days Post-Op Procedure(s) (LRB): HYSTERECTOMY ABDOMINAL, BILATERAL SALPINGO-OOPHORECTOMY , AND PELVIC MASS REMOVAL (Bilateral) CYSTOSCOPY (N/A)  Subjective: Patient reports incisional pain, tolerating PO (improved from yesterday when had N/V) and no problems voiding.  This am had onset of RUQ pain, awoke her this am, returned with quick onset at 10:30am.  Pain about an hour after ate bagel/cream cheese.  States passed gas yesterday, none today.    Objective: I have reviewed patient's vital signs, intake and output, medications and labs. Elevated BP - at baseline (122-156/85-101) last 122/89  General: alert and uncomfortable Resp: clear to auscultation bilaterally Cardio: regular rate and rhythm GI: soft, non-tender; bowel sounds quiet; no masses,  no organomegaly.  Inc C/D/I Extremities: extremities normal, atraumatic, no cyanosis or edema Vaginal Bleeding: none  Assessment: s/p Procedure(s): HYSTERECTOMY ABDOMINAL, BILATERAL SALPINGO-OOPHORECTOMY , AND PELVIC MASS REMOVAL (Bilateral) CYSTOSCOPY (N/A): stable and progressing well  Plan: NPO, IVF Encourage ambulation Advance to PO medication  Single dose of IV dilaudid (0.5) RUQ pain, worse with pressure KUB to eval for ileus CBC, CMP  reeval this pm   LOS: 3 days    Debbie Hill 03/08/2019, 8:55 AM

## 2019-03-09 MED ORDER — SIMETHICONE 80 MG PO CHEW: 80.0000 mg | CHEWABLE_TABLET | Freq: Four times a day (QID) | ORAL | 0 refills | Status: DC

## 2019-03-09 MED ORDER — DOCUSATE SODIUM 100 MG PO CAPS: 100.0000 mg | ORAL_CAPSULE | Freq: Two times a day (BID) | ORAL | 1 refills | Status: DC

## 2019-03-09 MED ORDER — IBUPROFEN 800 MG PO TABS
800.0000 mg | ORAL_TABLET | Freq: Three times a day (TID) | ORAL | Status: DC
  Administered 2019-03-09: 800 mg via ORAL
  Filled 2019-03-09: qty 1

## 2019-03-09 MED ORDER — IBUPROFEN 800 MG PO TABS: 800.0000 mg | ORAL_TABLET | Freq: Three times a day (TID) | ORAL | 1 refills | Status: AC

## 2019-03-09 MED ORDER — OXYCODONE-ACETAMINOPHEN 5-325 MG PO TABS: 1.0000 | ORAL_TABLET | Freq: Four times a day (QID) | ORAL | 0 refills | Status: DC | PRN

## 2019-03-09 MED ORDER — BISACODYL 10 MG RE SUPP: 10.0000 mg | Freq: Two times a day (BID) | RECTAL | 0 refills | Status: DC | PRN

## 2019-03-09 NOTE — Discharge Summary (Signed)
Physician Discharge Summary  Patient ID: Debbie Hill MRN: [id number] DOB/AGE: [birth year] 59 y.o.  Admit date: 03/05/2019 Discharge date: 03/09/2019  Admission Diagnoses: Pelvic mass in female  Discharge Diagnoses:  Principal Problem:   Pelvic mass in female   Discharged Condition: good  Hospital Course: Debbie Hill was admitted on 12/29 for a scheduled TAH/BSO/CYSTO for multicystic pelvic mass in a postmenopausal female with thickened endometrial lining. Uncomplicated procedure, please see operative note for full details. Final pathology benign. Postoperative course was notable for postop ileus. Ileus resolved by POD#4 with bowel rest, meds and increased ambulation. By POD#4, resolved with patient tolerating PO, passing both flatus and bowel movement, voiding without issue and ambulating well. Patient was discharged in stable condition.  Consults: None  Discharge Exam: Blood pressure (!) 149/86, pulse 78, temperature 99.1 F (37.3 C), temperature source Oral, resp. rate 16, height 5' (1.524 m), weight 60.8 kg, SpO2 98 %.  Physical Exam:  General: alert, cooperative and no distress. Sitting up in chair, appears more comfortable than yesterday afternoon Lochia:normal flow Chest: CTAB Heart: RRR no m/r/g Abdomen: +BSx4 and easier to appreciate than POD#3, appropriately tender. Epigastric distention/tympany significantly improved, minimally present this AM. Low vertical incision with intact suture, C/D/I, no surrounding erythema. Extremities: neg edema, neg calf TTP BL, neg Homans BL  Disposition: Discharge disposition: 01-Home or Self Care       Discharge Instructions    Call MD for:  difficulty breathing, headache or visual disturbances   Complete by: As directed    Call MD for:  hives   Complete by: As directed    Call MD for:  persistant dizziness or light-headedness   Complete by: As directed    Call MD for:  persistant nausea and vomiting   Complete by: As directed    Call MD for:  redness, tenderness, or signs of infection (pain, swelling, redness, odor or green/yellow discharge around incision site)   Complete by: As directed    Call MD for:  severe uncontrolled pain   Complete by: As directed    Call MD for:  temperature >100.4   Complete by: As directed    Diet - low sodium heart healthy   Complete by: As directed    Increase activity slowly   Complete by: As directed    Lifting restrictions   Complete by: As directed    No > 15lbs   May walk up steps   Complete by: As directed    No dressing needed   Complete by: As directed    No wound care   Complete by: As directed    Sexual Activity Restrictions   Complete by: As directed    None for 6 weeks     Allergies as of 03/09/2019   No Known Allergies     Medication List    TAKE these medications   aspirin-acetaminophen-caffeine 250-250-65 MG tablet Commonly known as: EXCEDRIN MIGRAINE Take 1 tablet by mouth daily as needed for headache or migraine.   bisacodyl 10 MG suppository Commonly known as: DULCOLAX Place 1 suppository (10 mg total) rectally 2 (two) times daily as needed for moderate constipation.   cholecalciferol 1000 units tablet Commonly known as: VITAMIN D Take 1,000 Units daily by mouth.   dexlansoprazole 60 MG capsule Commonly known as: DEXILANT Take 60 mg by mouth daily.   docusate sodium 100 MG capsule Commonly known as: COLACE Take 1 capsule (100 mg total) by mouth 2 (two) times daily.   escitalopram 10  MG tablet Commonly known as: LEXAPRO TAKE 1 TABLET DAILY   famotidine 20 MG tablet Commonly known as: PEPCID Take 20 mg by mouth 2 (two) times daily.   ibuprofen 800 MG tablet Commonly known as: ADVIL Take 1 tablet (800 mg total) by mouth every 8 (eight) hours.   metoprolol 200 MG 24 hr tablet Commonly known as: TOPROL-XL Take 1 tablet (200 mg total) by mouth daily. Take with or immediately following a meal.   MULTIVITAMIN PO Take 1 tablet by mouth  daily.   oxyCODONE-acetaminophen 5-325 MG tablet Commonly known as: PERCOCET/ROXICET Take 1 tablet by mouth every 6 (six) hours as needed for moderate pain.   simethicone 80 MG chewable tablet Commonly known as: MYLICON Chew 1 tablet (80 mg total) by mouth 4 (four) times daily.        Signed: Melida Quitter Ernesta Trabert 03/09/2019, 3:18 PM

## 2019-03-09 NOTE — Progress Notes (Signed)
POD #4  Subjective:  No acute events overnight.  Pt voiding and ambulting without issue.  Minimal nausea overnight, had small to medium-sized stools x3 now. Continues to pass flatus this morning. Appetite improved, would like to have some clears if possible. Pain well controlled.States suppository and ambulation plus PO meds  "made everything pop back into place."   Objective: Blood pressure (!) 162/97, pulse 82, temperature 99.1 F (37.3 C), temperature source Oral, resp. rate 18, height 5' (1.524 m), weight 60.8 kg, SpO2 93 %.  Physical Exam:  General: alert, cooperative and no distress. Sitting up in chair, appears more comfortable than yesterday afternoon Lochia:normal flow Chest: CTAB Heart: RRR no m/r/g Abdomen: +BSx4 and easier to appreciate than POD#3, appropriately tender. Epigastric distention/tympany significantly improved, minimally present this AM. Low vertical incision with intact suture, C/D/I, no surrounding erythema. Extremities: neg edema, neg calf TTP BL, neg Homans BL  Recent Labs    03/08/19 1153  HGB 13.6  HCT 40.9   Assessment/Plan:  ASSESSMENT: Debbie Hill is a 59 y.o. [id number] s/p TAH/BSO/cysto for pelvic mass in PMF. PMHx s/f HTN, depression, GERD. Postop course c/b ileus, now resolving.  1) Postop course -Transition back to PO pain meds. -Spontaneously voiding -Encourage ambulation and incentive spirometry -Scheduled bowel meds, will send home with Rx. Patient requesting another suppository this AM -Pathology overall benign:  FINAL MICROSCOPIC DIAGNOSIS:   A. PELVIC MASS, EXCISION:  - Seromucinous cystadenoma of fallopian tube, clinically left.   B. UTERUS, CERVIX AND BILATERAL FALLOPIAN TUBES AND OVARIES,  HYSTERECTOMY AND BILATERAL SALPINGO-OOPHORECTOMY:  Cervix:  - Mild acute and chronic cervicitis.   Uterus:  - Endometrium: Endometrial polyps with breakdown and surface reactive  change.  - Myometrium: Adenomyosis. Leiomyomata.  - Serosa:  No significant histopathologic findings.   Adnexa:  - Right fallopian tube and left fallopian tube tissue: No significant  histopathologic findings.  - Ovaries: Serous cystadenofibromata.  FLUID CYTOLOGY: FINAL MICROSCOPIC DIAGNOSIS:  - Reactive mesothelial cells present   2) Postop ileus - Improving -KUB POD#3 with dilated loops of bowel -Distention/tympany significantly improved POD#4, +flatus and BM, improved appetite -Will trial PO diet today  May discharge patient home this afternoon if pain well-controlled and patient tolerates PO. RTO risks reviewed extensively with patient   LOS: 4 days

## 2019-03-09 NOTE — Progress Notes (Signed)
Patient discharged to home. Verbalizes understanding of all discharge instructions including incision care, discharge medications, and follow up MD visits.  

## 2019-04-01 ENCOUNTER — Other Ambulatory Visit: Payer: Self-pay | Admitting: Family Medicine

## 2019-04-05 DIAGNOSIS — R350 Frequency of micturition: Secondary | ICD-10-CM | POA: Diagnosis not present

## 2019-04-05 DIAGNOSIS — N390 Urinary tract infection, site not specified: Secondary | ICD-10-CM | POA: Diagnosis not present

## 2019-04-10 DIAGNOSIS — K219 Gastro-esophageal reflux disease without esophagitis: Secondary | ICD-10-CM | POA: Diagnosis not present

## 2019-04-16 DIAGNOSIS — Z87440 Personal history of urinary (tract) infections: Secondary | ICD-10-CM | POA: Diagnosis not present

## 2019-05-07 ENCOUNTER — Other Ambulatory Visit: Payer: Self-pay | Admitting: *Deleted

## 2019-05-07 ENCOUNTER — Other Ambulatory Visit: Payer: Self-pay | Admitting: Obstetrics and Gynecology

## 2019-05-07 DIAGNOSIS — Z1231 Encounter for screening mammogram for malignant neoplasm of breast: Secondary | ICD-10-CM

## 2019-06-05 ENCOUNTER — Other Ambulatory Visit: Payer: Self-pay

## 2019-06-05 ENCOUNTER — Ambulatory Visit
Admission: RE | Admit: 2019-06-05 | Discharge: 2019-06-05 | Disposition: A | Discharge status: Home / Self Care | Payer: BC Managed Care – PPO | Source: Ambulatory Visit | Attending: Obstetrics and Gynecology | Admitting: Obstetrics and Gynecology

## 2019-06-05 DIAGNOSIS — Z1231 Encounter for screening mammogram for malignant neoplasm of breast: Secondary | ICD-10-CM

## 2019-07-31 DIAGNOSIS — L02214 Cutaneous abscess of groin: Secondary | ICD-10-CM | POA: Diagnosis not present

## 2019-09-26 DIAGNOSIS — L089 Local infection of the skin and subcutaneous tissue, unspecified: Secondary | ICD-10-CM | POA: Diagnosis not present

## 2020-01-06 ENCOUNTER — Other Ambulatory Visit: Payer: Self-pay | Admitting: Family Medicine

## 2020-02-25 ENCOUNTER — Telehealth: Payer: BC Managed Care – PPO | Admitting: Family Medicine

## 2020-03-26 ENCOUNTER — Other Ambulatory Visit: Payer: Self-pay | Admitting: Family Medicine

## 2020-03-26 NOTE — Telephone Encounter (Signed)
Last OV 12/24/18 Last refill 04/04/19 #90/3 Next OV not scheduled

## 2020-04-17 LAB — BASIC METABOLIC PANEL
BUN: 17 (ref 4–21)
CO2: 30 — AB (ref 13–22)
Chloride: 104 (ref 99–108)
Creatinine: 0.7 (ref 0.5–1.1)
Glucose: 101
Potassium: 4.2 (ref 3.4–5.3)
Sodium: 140 (ref 137–147)

## 2020-04-17 LAB — VITAMIN D 25 HYDROXY (VIT D DEFICIENCY, FRACTURES): Vit D, 25-Hydroxy: 23.5

## 2020-04-17 LAB — CBC AND DIFFERENTIAL
HCT: 45 (ref 36–46)
Hemoglobin: 14.8 (ref 12.0–16.0)
Platelets: 344 (ref 150–399)
WBC: 4.9

## 2020-04-17 LAB — COMPREHENSIVE METABOLIC PANEL
Calcium: 10 (ref 8.7–10.7)
GFR calc Af Amer: 112
GFR calc non Af Amer: 93

## 2020-04-17 LAB — CBC: RBC: 5.39 — AB (ref 3.87–5.11)

## 2020-04-29 ENCOUNTER — Encounter: Payer: Self-pay | Admitting: Family Medicine

## 2020-06-24 ENCOUNTER — Other Ambulatory Visit: Payer: Self-pay | Admitting: Family Medicine

## 2020-07-02 ENCOUNTER — Other Ambulatory Visit: Payer: Self-pay

## 2020-07-02 ENCOUNTER — Ambulatory Visit (INDEPENDENT_AMBULATORY_CARE_PROVIDER_SITE_OTHER): Payer: PRIVATE HEALTH INSURANCE | Admitting: Family Medicine

## 2020-07-02 ENCOUNTER — Encounter: Payer: Self-pay | Admitting: Family Medicine

## 2020-07-02 VITALS — BP 155/98 | HR 68 | Temp 98.0°F | Ht 60.0 in | Wt 137.8 lb

## 2020-07-02 DIAGNOSIS — I10 Essential (primary) hypertension: Secondary | ICD-10-CM | POA: Diagnosis not present

## 2020-07-02 DIAGNOSIS — R7303 Prediabetes: Secondary | ICD-10-CM

## 2020-07-02 DIAGNOSIS — E785 Hyperlipidemia, unspecified: Secondary | ICD-10-CM

## 2020-07-02 DIAGNOSIS — F325 Major depressive disorder, single episode, in full remission: Secondary | ICD-10-CM | POA: Diagnosis not present

## 2020-07-02 LAB — LIPID PANEL
Cholesterol: 223 mg/dL — ABNORMAL HIGH (ref 0–200)
HDL: 48 mg/dL (ref >39.00)
Total CHOL/HDL Ratio: 5
Triglycerides: 483 mg/dL — ABNORMAL HIGH (ref 0.0–149.0)

## 2020-07-02 LAB — LDL CHOLESTEROL, DIRECT: Direct LDL: 152 mg/dL

## 2020-07-02 LAB — TSH: TSH: 1.78 u[IU]/mL (ref 0.35–4.50)

## 2020-07-02 LAB — HEMOGLOBIN A1C: Hgb A1c MFr Bld: 6.4 % (ref 4.6–6.5)

## 2020-07-02 MED ORDER — ESCITALOPRAM OXALATE 10 MG PO TABS: 1.0000 | ORAL_TABLET | Freq: Every day | ORAL | 3 refills | Status: DC

## 2020-07-02 MED ORDER — METOPROLOL SUCCINATE ER 200 MG PO TB24: ORAL_TABLET | ORAL | 0 refills | Status: DC

## 2020-07-02 NOTE — Progress Notes (Signed)
   Debbie Hill is a 60 y.o. female who presents today for an office visit.  Assessment/Plan:  New/Acute Problems: Palpitations No red flags.  Possibly could be due to elevated blood pressure readings.  We will check labs today and work on blood pressure control.  If symptoms persist and no obvious explanation on her labs would consider Holter monitor.  She is likely having PVCs.  She will let me know if her symptoms change or develops other symptoms.  Chronic Problems Addressed Today: Prediabetes Check A1c.  Dyslipidemia Check lipids.  Continue lifestyle modifications.  Depression, major, single episode, complete remission (HCC) Stable on Lexapro 10 mg daily.  Will refill today.  Hypertension Above goal.  She is on metoprolol 200 mg daily.  We will check TSH today as this has not been checked recently.  Her recent CBC and c-Met were within normal limits.  If her TSH is normal we will likely add on amlodipine 10 mg daily.  Discussed importance of lifestyle modifications.    Subjective:  HPI:  See A/P for status of chronic conditions.  Patient has been having intermittent palpitations for the last several weeks.  Cursorily.  No reported chest pain or shortness of breath.  Last for a few minutes and then subsides.       Objective:  Physical Exam: BP (!) 155/98   Pulse 68   Temp 98 F (36.7 C) (Temporal)   Ht 5' (1.524 m)   Wt 137 lb 12.8 oz (62.5 kg)   SpO2 96%   BMI 26.91 kg/m   Gen: No acute distress, resting comfortably CV: Regular rate and rhythm with no murmurs appreciated Pulm: Normal work of breathing, clear to auscultation bilaterally with no crackles, wheezes, or rhonchi Neuro: Grossly normal, moves all extremities Psych: Normal affect and thought content      Timm Bonenberger M. [name], MD 07/02/2020 11:21 AM

## 2020-07-02 NOTE — Assessment & Plan Note (Signed)
Stable on Lexapro 10 mg daily.  Will refill today.

## 2020-07-02 NOTE — Assessment & Plan Note (Signed)
Check A1c. 

## 2020-07-02 NOTE — Assessment & Plan Note (Signed)
Check lipids.  Continue lifestyle modifications. 

## 2020-07-02 NOTE — Assessment & Plan Note (Signed)
Above goal.  She is on metoprolol 200 mg daily.  We will check TSH today as this has not been checked recently.  Her recent CBC and c-Met were within normal limits.  If her TSH is normal we will likely add on amlodipine 10 mg daily.  Discussed importance of lifestyle modifications.

## 2020-07-02 NOTE — Patient Instructions (Signed)
It was very nice to see you today!  I think your palpitations could be coming from your blood pressure.  We will check blood work today to make sure there is nothing else that is going on.  Depending on results of you blood work we may need to start an additional blood pressure medication.  If your palpitations persist we may need to get a heart monitor for a few days.  I will refill your medications today.  Please keep an eye on your blood pressure and let me know if persistently 140/90 or higher.    Take care, Dr [name]  PLEASE NOTE:  If you had any lab tests please let us know if you have not heard back within a few days. You may see your results on mychart before we have a chance to review them but we will give you a call once they are reviewed by [place]. If we ordered any referrals today, please let us know if you have not heard from their office within the next week.   Please try these tips to maintain a healthy lifestyle:   Eat at least 3 REAL meals and 1-2 snacks per day.  Aim for no more than 5 hours between eating.  If you eat breakfast, please do so within one hour of getting up.    Each meal should contain half fruits/vegetables, one quarter protein, and one quarter carbs (no bigger than a computer mouse)   Cut down on sweet beverages. This includes juice, soda, and sweet tea.     Drink at least 1 glass of water with each meal and aim for at least 8 glasses per day   Exercise at least 150 minutes every week.

## 2020-07-03 NOTE — Progress Notes (Signed)
Please inform patient of the following:  Her thyroid level is normal but her blood sugar is elevated and.  Near diabetic range.  Recommend we start metformin 500 mg daily and recheck A1c in 3 to 6 months.  Her cholesterol levels are also borderline.  We do not need to start medications yet but we really need to work on improving her numbers.  She should continue working on diet and exercise and we can recheck in a year or so.  I would like for her to start amlodipine 10 mg daily.  This will help with her blood pressure and potentially palpitations as well.  I would like for her to come back in 1 to 2 weeks for a follow-up visit to recheck blood pressure and follow-up on her palpitations.  Would like for her to let us know if her symptoms are worsening or not improving before then.

## 2020-07-06 ENCOUNTER — Telehealth: Payer: Self-pay

## 2020-07-06 NOTE — Telephone Encounter (Signed)
patient is returning call regarding labs

## 2020-07-07 ENCOUNTER — Other Ambulatory Visit: Payer: Self-pay | Admitting: *Deleted

## 2020-07-07 MED ORDER — METFORMIN HCL 500 MG PO TABS: 500.0000 mg | ORAL_TABLET | Freq: Every day | ORAL | 1 refills | Status: DC

## 2020-07-07 MED ORDER — AMLODIPINE BESYLATE 10 MG PO TABS: 10.0000 mg | ORAL_TABLET | Freq: Every day | ORAL | 1 refills | Status: DC

## 2020-07-08 NOTE — Telephone Encounter (Signed)
Spoke with patient, see results note

## 2020-07-27 ENCOUNTER — Other Ambulatory Visit: Payer: Self-pay

## 2020-07-27 ENCOUNTER — Ambulatory Visit (INDEPENDENT_AMBULATORY_CARE_PROVIDER_SITE_OTHER): Payer: PRIVATE HEALTH INSURANCE | Admitting: Family Medicine

## 2020-07-27 ENCOUNTER — Encounter: Payer: Self-pay | Admitting: Family Medicine

## 2020-07-27 VITALS — BP 119/79 | HR 78 | Temp 97.9°F | Ht 60.0 in | Wt 136.6 lb

## 2020-07-27 DIAGNOSIS — E785 Hyperlipidemia, unspecified: Secondary | ICD-10-CM | POA: Diagnosis not present

## 2020-07-27 DIAGNOSIS — R7303 Prediabetes: Secondary | ICD-10-CM | POA: Diagnosis not present

## 2020-07-27 DIAGNOSIS — I10 Essential (primary) hypertension: Secondary | ICD-10-CM | POA: Diagnosis not present

## 2020-07-27 NOTE — Assessment & Plan Note (Signed)
Last A1c 6.4.  We started on metformin 500mg  daily.  She has been tolerating well.  We will continue.  Recheck A1c in 2 to 3 months.

## 2020-07-27 NOTE — Assessment & Plan Note (Signed)
Elevated LDL on last blood draw.  We can recheck at next CPE.

## 2020-07-27 NOTE — Progress Notes (Signed)
   Debbie Hill is a 60 y.o. female who presents today for an office visit.  Assessment/Plan:  Chronic Problems Addressed Today: Prediabetes Last A1c 6.4.  We started on metformin 500mg  daily.  She has been tolerating well.  We will continue.  Recheck A1c in 2 to 3 months.  Dyslipidemia Elevated LDL on last blood draw.  We can recheck at next CPE.  Hypertension Much better controlled today.  Has not had any recurrence of headaches.  Continue metoprolol 200 mg daily and amlodipine 10 mg daily.  She has been tolerating well out side effects.  We will recheck again in a few months.  May be able to decrease dose of amlodipine as tolerated.    Subjective:  HPI:  See A/p.         Objective:  Physical Exam: BP 119/79   Pulse 78   Temp 97.9 F (36.6 C) (Temporal)   Ht 5' (1.524 m)   Wt 136 lb 9.6 oz (62 kg)   SpO2 99%   BMI 26.68 kg/m   Wt Readings from Last 3 Encounters:  07/27/20 136 lb 9.6 oz (62 kg)  07/02/20 137 lb 12.8 oz (62.5 kg)  03/05/19 134 lb (60.8 kg)  Gen: No acute distress, resting comfortably CV: Regular rate and rhythm with no murmurs appreciated Pulm: Normal work of breathing, clear to auscultation bilaterally with no crackles, wheezes, or rhonchi Neuro: Grossly normal, moves all extremities Psych: Normal affect and thought content      Debbie Hill M. [name], MD 07/27/2020 3:29 PM

## 2020-07-27 NOTE — Assessment & Plan Note (Signed)
Much better controlled today.  Has not had any recurrence of headaches.  Continue metoprolol 200 mg daily and amlodipine 10 mg daily.  She has been tolerating well out side effects.  We will recheck again in a few months.  May be able to decrease dose of amlodipine as tolerated.

## 2020-07-27 NOTE — Patient Instructions (Signed)
It was very nice to see you today!  Your blood pressure looks great today.  I am glad that you are doing well.  No medication changes today.  I would like to see you back on 10/01/2020 or later to recheck your A1c and blood pressure.  Please come back to see me sooner if needed.  Take care, Dr [name]  PLEASE NOTE:  If you had any lab tests please let us know if you have not heard back within a few days. You may see your results on mychart before we have a chance to review them but we will give you a call once they are reviewed by [place]. If we ordered any referrals today, please let us know if you have not heard from their office within the next week.   Please try these tips to maintain a healthy lifestyle:   Eat at least 3 REAL meals and 1-2 snacks per day.  Aim for no more than 5 hours between eating.  If you eat breakfast, please do so within one hour of getting up.    Each meal should contain half fruits/vegetables, one quarter protein, and one quarter carbs (no bigger than a computer mouse)   Cut down on sweet beverages. This includes juice, soda, and sweet tea.     Drink at least 1 glass of water with each meal and aim for at least 8 glasses per day   Exercise at least 150 minutes every week.

## 2020-09-01 ENCOUNTER — Other Ambulatory Visit: Payer: Self-pay | Admitting: Obstetrics and Gynecology

## 2020-09-01 DIAGNOSIS — Z1231 Encounter for screening mammogram for malignant neoplasm of breast: Secondary | ICD-10-CM

## 2020-09-22 ENCOUNTER — Other Ambulatory Visit: Payer: Self-pay | Admitting: Family Medicine

## 2020-11-27 ENCOUNTER — Ambulatory Visit
Admission: RE | Admit: 2020-11-27 | Discharge: 2020-11-27 | Disposition: A | Discharge status: Home / Self Care | Payer: PRIVATE HEALTH INSURANCE | Source: Ambulatory Visit | Attending: Obstetrics and Gynecology | Admitting: Obstetrics and Gynecology

## 2020-11-27 ENCOUNTER — Other Ambulatory Visit: Payer: Self-pay

## 2020-11-27 DIAGNOSIS — Z1231 Encounter for screening mammogram for malignant neoplasm of breast: Secondary | ICD-10-CM

## 2020-12-04 ENCOUNTER — Other Ambulatory Visit: Payer: Self-pay | Admitting: Obstetrics and Gynecology

## 2020-12-04 DIAGNOSIS — R928 Other abnormal and inconclusive findings on diagnostic imaging of breast: Secondary | ICD-10-CM

## 2020-12-11 ENCOUNTER — Other Ambulatory Visit: Payer: Self-pay | Admitting: Family Medicine

## 2020-12-16 ENCOUNTER — Other Ambulatory Visit: Payer: Self-pay | Admitting: Obstetrics and Gynecology

## 2020-12-16 DIAGNOSIS — R928 Other abnormal and inconclusive findings on diagnostic imaging of breast: Secondary | ICD-10-CM

## 2020-12-18 ENCOUNTER — Ambulatory Visit: Payer: PRIVATE HEALTH INSURANCE

## 2020-12-18 ENCOUNTER — Ambulatory Visit
Admission: RE | Admit: 2020-12-18 | Discharge: 2020-12-18 | Disposition: A | Discharge status: Home / Self Care | Payer: PRIVATE HEALTH INSURANCE | Source: Ambulatory Visit | Attending: Obstetrics and Gynecology | Admitting: Obstetrics and Gynecology

## 2020-12-18 ENCOUNTER — Other Ambulatory Visit: Payer: Self-pay

## 2020-12-18 DIAGNOSIS — R928 Other abnormal and inconclusive findings on diagnostic imaging of breast: Secondary | ICD-10-CM

## 2021-03-12 ENCOUNTER — Encounter: Payer: Self-pay | Admitting: Family Medicine

## 2021-03-12 ENCOUNTER — Ambulatory Visit (INDEPENDENT_AMBULATORY_CARE_PROVIDER_SITE_OTHER): Payer: PRIVATE HEALTH INSURANCE | Admitting: Family Medicine

## 2021-03-12 ENCOUNTER — Other Ambulatory Visit: Payer: Self-pay

## 2021-03-12 VITALS — BP 148/88 | HR 88 | Temp 98.1°F | Ht 60.0 in

## 2021-03-12 DIAGNOSIS — I10 Essential (primary) hypertension: Secondary | ICD-10-CM

## 2021-03-12 DIAGNOSIS — R058 Other specified cough: Secondary | ICD-10-CM

## 2021-03-12 DIAGNOSIS — J029 Acute pharyngitis, unspecified: Secondary | ICD-10-CM

## 2021-03-12 DIAGNOSIS — R7303 Prediabetes: Secondary | ICD-10-CM | POA: Diagnosis not present

## 2021-03-12 LAB — POCT INFLUENZA A/B
Influenza A, POC: NEGATIVE
Influenza B, POC: NEGATIVE

## 2021-03-12 LAB — POCT RAPID STREP A (OFFICE): Rapid Strep A Screen: NEGATIVE

## 2021-03-12 LAB — POC COVID19 BINAXNOW: SARS Coronavirus 2 Ag: NEGATIVE

## 2021-03-12 MED ORDER — AZITHROMYCIN 250 MG PO TABS: ORAL_TABLET | ORAL | 0 refills | Status: DC

## 2021-03-12 MED ORDER — GUAIFENESIN-CODEINE 100-10 MG/5ML PO SOLN: 5.0000 mL | Freq: Three times a day (TID) | ORAL | 0 refills | Status: DC | PRN

## 2021-03-12 MED ORDER — AZELASTINE HCL 0.1 % NA SOLN: 2.0000 | Freq: Two times a day (BID) | NASAL | 12 refills | Status: DC

## 2021-03-12 NOTE — Patient Instructions (Signed)
It was very nice to see you today!  I am sorry that you are feeling sick.  Your test today here was negative.  Please start the cough syrup and nasal spray.  Please start the antibiotic if you not feeling better in the next few days or if your symptoms worsen.  Please come back soon to have your blood sugar rechecked.  Take care, Dr [name]  PLEASE NOTE:  If you had any lab tests please let us know if you have not heard back within a few days. You may see your results on mychart before we have a chance to review them but we will give you a call once they are reviewed by [place]. If we ordered any referrals today, please let us know if you have not heard from their office within the next week.   Please try these tips to maintain a healthy lifestyle:  Eat at least 3 REAL meals and 1-2 snacks per day.  Aim for no more than 5 hours between eating.  If you eat breakfast, please do so within one hour of getting up.   Each meal should contain half fruits/vegetables, one quarter protein, and one quarter carbs (no bigger than a computer mouse)  Cut down on sweet beverages. This includes juice, soda, and sweet tea.   Drink at least 1 glass of water with each meal and aim for at least 8 glasses per day  Exercise at least 150 minutes every week.

## 2021-03-12 NOTE — Progress Notes (Signed)
° °  Debbie Hill is a 61 y.o. female who presents today for an office visit.  Assessment/Plan:  New/Acute Problems: Sinusitis Likely viral URI.  No red flag signs or symptoms.  Will start Astelin.  Also start guaifenesin codeine cough syrup.  Encouraged hydration.  Can use over-the-counter meds as needed.  We will send pocket prescription for azithromycin with instruction to not start unless symptoms do not improve over the next several days.  Discussed reasons to return to care.  Chronic Problems Addressed Today: Prediabetes She cannot tolerate metformin due to side effects.  She will come back soon to recheck A1c.  Hypertension Slightly above goal in setting of acute illness.  Has previously been well controlled.  Continue metoprolol succinate 200 mg daily.  Continue home monitoring.  She will follow-up soon for CPE.     Subjective:  HPI:  See A/P for status of chronic conditions.  Patient here with sore throat and cough for a few days. Also some chest congestion. Cough is not productive. Throat feels very raw. Some subjective fevers. No treatments tried. Symptoms are getting worse. Some fatigue.  No known sick contacts.       Objective:  Physical Exam: BP (!) 148/88 (BP Location: Left Arm, Patient Position: Sitting, Cuff Size: Normal)    Pulse 88    Temp 98.1 F (36.7 C) (Temporal)    Ht 5' (1.524 m)    SpO2 98%    BMI 26.68 kg/m   Gen: No acute distress, resting comfortably HEENT: TMs clear.  OP erythematous with no exudate.  Nasal mucosa erythematous and boggy bilaterally with clear discharge. CV: Regular rate and rhythm with no murmurs appreciated Pulm: Normal work of breathing, clear to auscultation bilaterally with no crackles, wheezes, or rhonchi Neuro: Grossly normal, moves all extremities Psych: Normal affect and thought content      Debbie Hill M. [name], MD 03/12/2021 9:36 AM

## 2021-03-12 NOTE — Assessment & Plan Note (Signed)
Slightly above goal in setting of acute illness.  Has previously been well controlled.  Continue metoprolol succinate 200 mg daily.  Continue home monitoring.  She will follow-up soon for CPE.

## 2021-03-12 NOTE — Assessment & Plan Note (Signed)
She cannot tolerate metformin due to side effects.  She will come back soon to recheck A1c.

## 2021-06-24 LAB — VITAMIN D 25 HYDROXY (VIT D DEFICIENCY, FRACTURES): Vit D, 25-Hydroxy: 38

## 2021-07-07 ENCOUNTER — Encounter: Payer: Self-pay | Admitting: Family Medicine

## 2021-09-17 ENCOUNTER — Other Ambulatory Visit: Payer: Self-pay | Admitting: Family Medicine

## 2021-09-27 ENCOUNTER — Other Ambulatory Visit: Payer: Self-pay | Admitting: Family Medicine

## 2021-10-20 ENCOUNTER — Encounter: Payer: Self-pay | Admitting: Family Medicine

## 2021-10-20 ENCOUNTER — Ambulatory Visit (INDEPENDENT_AMBULATORY_CARE_PROVIDER_SITE_OTHER): Payer: PRIVATE HEALTH INSURANCE | Admitting: Family Medicine

## 2021-10-20 ENCOUNTER — Ambulatory Visit (HOSPITAL_BASED_OUTPATIENT_CLINIC_OR_DEPARTMENT_OTHER)
Admission: RE | Admit: 2021-10-20 | Discharge: 2021-10-20 | Disposition: A | Discharge status: Home / Self Care | Payer: PRIVATE HEALTH INSURANCE | Source: Ambulatory Visit | Attending: Family Medicine | Admitting: Family Medicine

## 2021-10-20 VITALS — BP 120/78 | HR 69 | Temp 97.8°F | Ht 60.0 in | Wt 137.4 lb

## 2021-10-20 DIAGNOSIS — I10 Essential (primary) hypertension: Secondary | ICD-10-CM | POA: Diagnosis not present

## 2021-10-20 DIAGNOSIS — J309 Allergic rhinitis, unspecified: Secondary | ICD-10-CM

## 2021-10-20 DIAGNOSIS — R059 Cough, unspecified: Secondary | ICD-10-CM | POA: Insufficient documentation

## 2021-10-20 DIAGNOSIS — K219 Gastro-esophageal reflux disease without esophagitis: Secondary | ICD-10-CM

## 2021-10-20 MED ORDER — AZITHROMYCIN 250 MG PO TABS: ORAL_TABLET | ORAL | 0 refills | Status: DC

## 2021-10-20 NOTE — Progress Notes (Signed)
   Debbie Hill is a 61 y.o. female who presents today for an office visit.  Assessment/Plan:  New/Acute Problems: Cough Likely due to postnasal drip and rhinitis.  Her lung exam today is reassuring however given length of symptoms we will check x-ray for further evaluation.  She does have underlying history of GERD that has been well controlled on Nexium-do not think this is contributing significantly.  We will treat her postnasal drip and rhinitis with over-the-counter antihistamine such as Claritin.  Also recommend she start Flonase.  We will also start azithromycin to treat any underlying possible sinusitis.  We discussed reasons to return to care.  If her cough persist despite treatment for both her GERD and sinusitis she will need referral to pulmonology for further evaluation.  Chronic Problems Addressed Today: Allergic rhinitis Shoulder.  Symptoms been persistent for several years.  Likely contributing to the cough.  Recommend she try over-the-counter Claritin or Zyrtec.  She can also try using Flonase.  GERD (gastroesophageal reflux disease) Follows with GI.  She is currently on Nexium 40 mg daily.  Symptoms have been well controlled.  Hypertension At goal today on current regimen of amlodipine 10 mg daily and metoprolol succinate 200 mg daily.     Subjective:  HPI:  Patient here with cough on and off for the last several months. Sometimes she will have a coughing fit that has a barking coughing fit.  She has tried lozenges without much improvement. She has noticed increased shortness of breath with exertion. No nausea or vomiting. No fevers or chills. Stable the last few weeks.   Her reflux symptoms have been controlled on esomeprazole '40mg'$  daily. She has some post nasal drip that has been persistent. She does not take anything for this specifically.      Objective:  Physical Exam: BP 120/78   Pulse 69   Temp 97.8 F (36.6 C)   Ht 5' (1.524 m)   Wt 137 lb 6.4 oz (62.3 kg)    SpO2 96%   BMI 26.83 kg/m   Gen: No acute distress, resting comfortably HEENT: TMs with clear effusion.  Nasal mucosa erythematous and boggy bilaterally. CV: Regular rate and rhythm with no murmurs appreciated Pulm: Normal work of breathing, clear to auscultation bilaterally with no crackles, wheezes, or rhonchi Neuro: Grossly normal, moves all extremities Psych: Normal affect and thought content      Kaylon Laroche M. [name], MD 10/20/2021 9:53 AM

## 2021-10-20 NOTE — Assessment & Plan Note (Signed)
Shoulder.  Symptoms been persistent for several years.  Likely contributing to the cough.  Recommend she try over-the-counter Claritin or Zyrtec.  She can also try using Flonase.

## 2021-10-20 NOTE — Assessment & Plan Note (Signed)
Follows with GI.  She is currently on Nexium 40 mg daily.  Symptoms have been well controlled.

## 2021-10-20 NOTE — Assessment & Plan Note (Signed)
At goal today on current regimen of amlodipine 10 mg daily and metoprolol succinate 200 mg daily.

## 2021-10-20 NOTE — Patient Instructions (Signed)
It was very nice to see you today!  I think your postnasal drip is probably causing your cough.  Please try taking an over-the-counter allergy medication such as Claritin or Zyrtec.  Please also try using Flonase.  I will send in a Z-Pak to treat any underlying sinus infection.  We will get an x-ray today.  You can go to the drug [facility] facility to have this done.  Let me know if your symptoms or not improving in the next 1 to 2 weeks.  Take care, Dr [name]  PLEASE NOTE:  If you had any lab tests please let us know if you have not heard back within a few days. You may see your results on mychart before we have a chance to review them but we will give you a call once they are reviewed by [place]. If we ordered any referrals today, please let us know if you have not heard from their office within the next week.   Please try these tips to maintain a healthy lifestyle:  Eat at least 3 REAL meals and 1-2 snacks per day.  Aim for no more than 5 hours between eating.  If you eat breakfast, please do so within one hour of getting up.   Each meal should contain half fruits/vegetables, one quarter protein, and one quarter carbs (no bigger than a computer mouse)  Cut down on sweet beverages. This includes juice, soda, and sweet tea.   Drink at least 1 glass of water with each meal and aim for at least 8 glasses per day  Exercise at least 150 minutes every week.

## 2021-10-21 ENCOUNTER — Telehealth: Payer: Self-pay | Admitting: Family Medicine

## 2021-10-21 NOTE — Telephone Encounter (Addendum)
Pt states: -had xray yesterday (10/20/21) -understanding that data from imaging has been received but not interpreted  Pt requests: -call back about results, not a MyChart message.

## 2021-10-22 NOTE — Progress Notes (Signed)
Please inform patient of the following:  Good news!  Her x-ray is normal.  Would like for her to let us know if her cough is not improving over the next couple of weeks.

## 2021-10-23 NOTE — Telephone Encounter (Signed)
See results note. 

## 2021-10-29 ENCOUNTER — Encounter: Payer: Self-pay | Admitting: Family Medicine

## 2021-10-29 NOTE — Telephone Encounter (Signed)
error 

## 2021-10-29 NOTE — Telephone Encounter (Signed)
FYI

## 2021-11-10 ENCOUNTER — Encounter: Payer: PRIVATE HEALTH INSURANCE | Admitting: Family Medicine

## 2021-11-24 ENCOUNTER — Other Ambulatory Visit: Payer: Self-pay | Admitting: Obstetrics and Gynecology

## 2021-11-24 DIAGNOSIS — Z1231 Encounter for screening mammogram for malignant neoplasm of breast: Secondary | ICD-10-CM

## 2021-11-29 ENCOUNTER — Encounter: Payer: Self-pay | Admitting: *Deleted

## 2021-12-06 ENCOUNTER — Other Ambulatory Visit: Payer: Self-pay | Admitting: Family Medicine

## 2021-12-15 IMAGING — MG DIGITAL DIAGNOSTIC BILAT W/ TOMO W/ CAD
8 series · 8 of 24 positions shown · non-contrast
Comparison: Previous exam(s).

CLINICAL DATA: Screening recall for a right breast asymmetry and a
possible left breast mass.

EXAM:
DIGITAL DIAGNOSTIC BILATERAL MAMMOGRAM WITH TOMOSYNTHESIS AND CAD
TECHNIQUE: Bilateral digital diagnostic mammography and breast tomosynthesis
was performed. The images were evaluated with computer-aided
detection.

[R CC synth-2D]
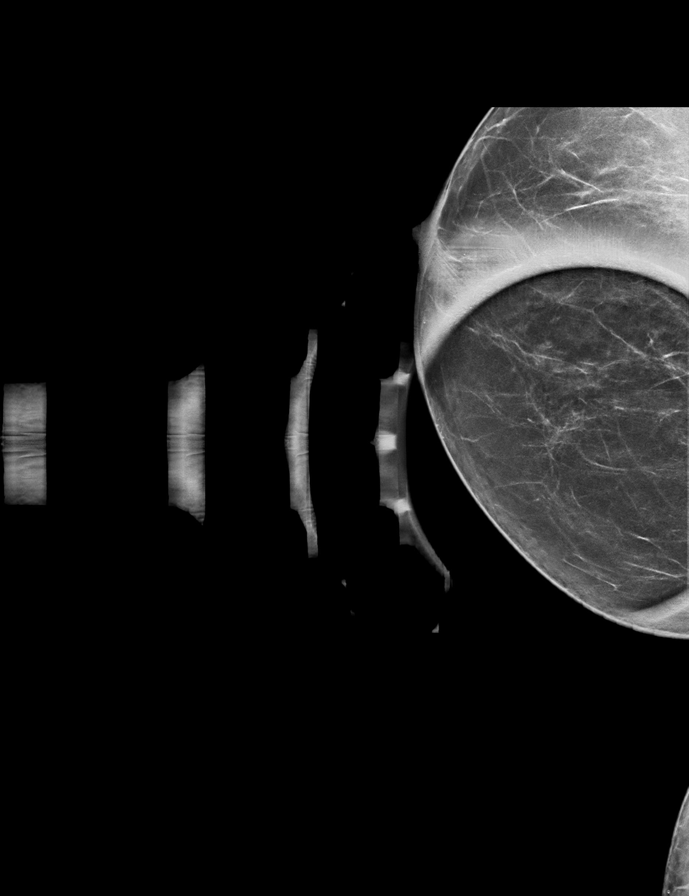

[L MLO synth-2D]
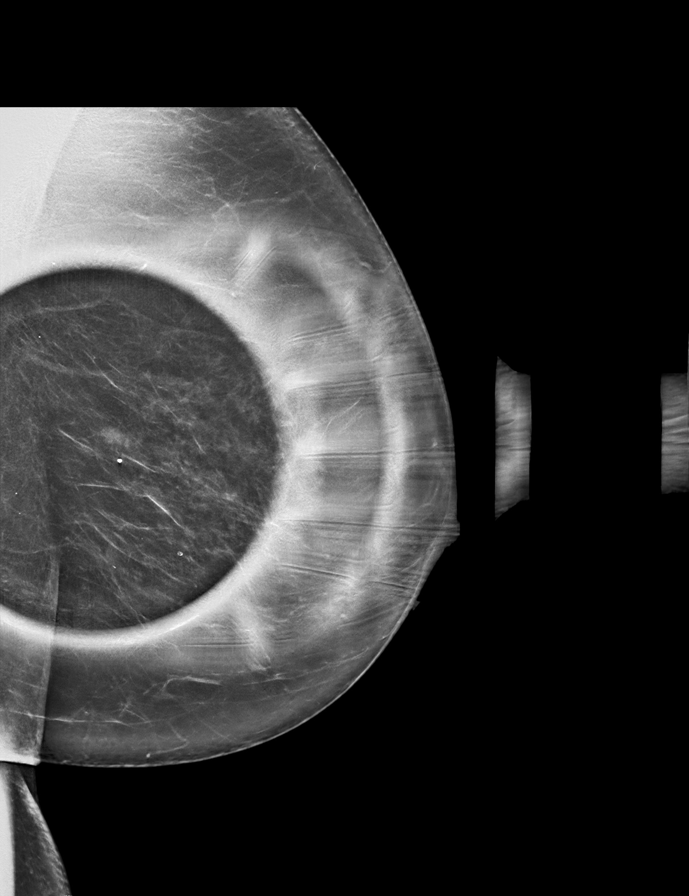

[L CC synth-2D]
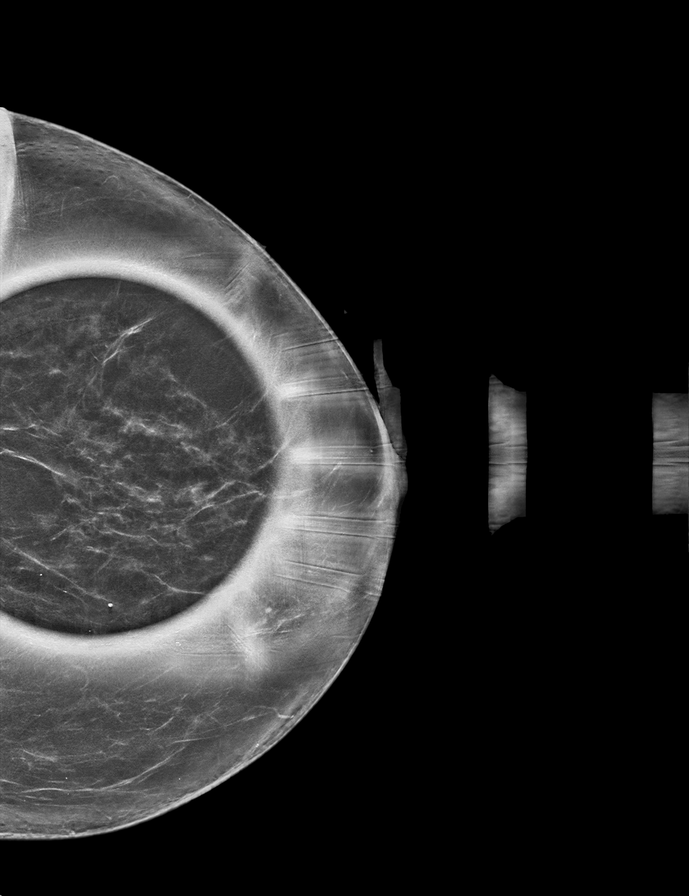

[R ML synth-2D]
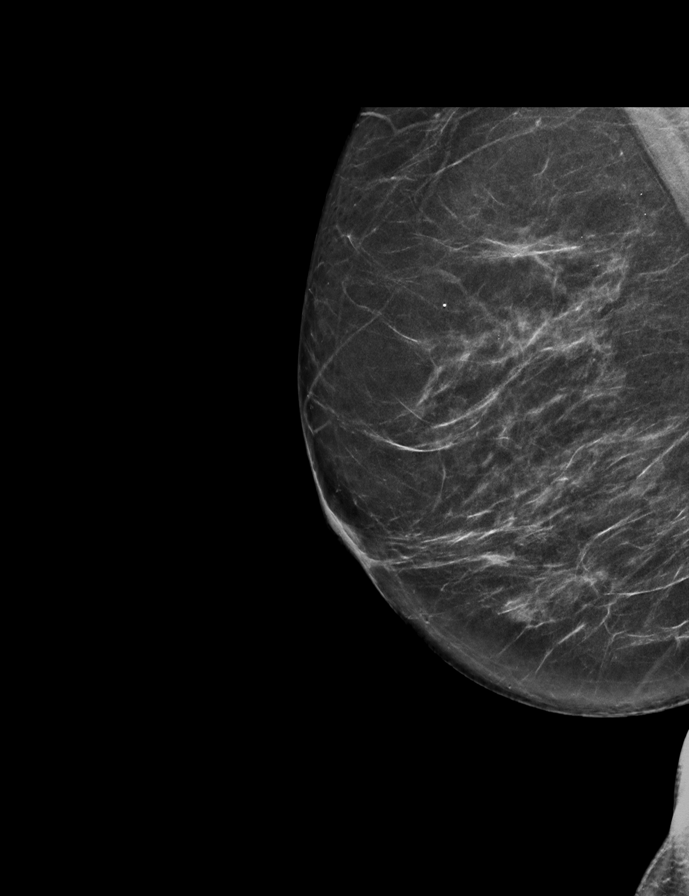

[R CC tomo · tomo slice 29/58.0]
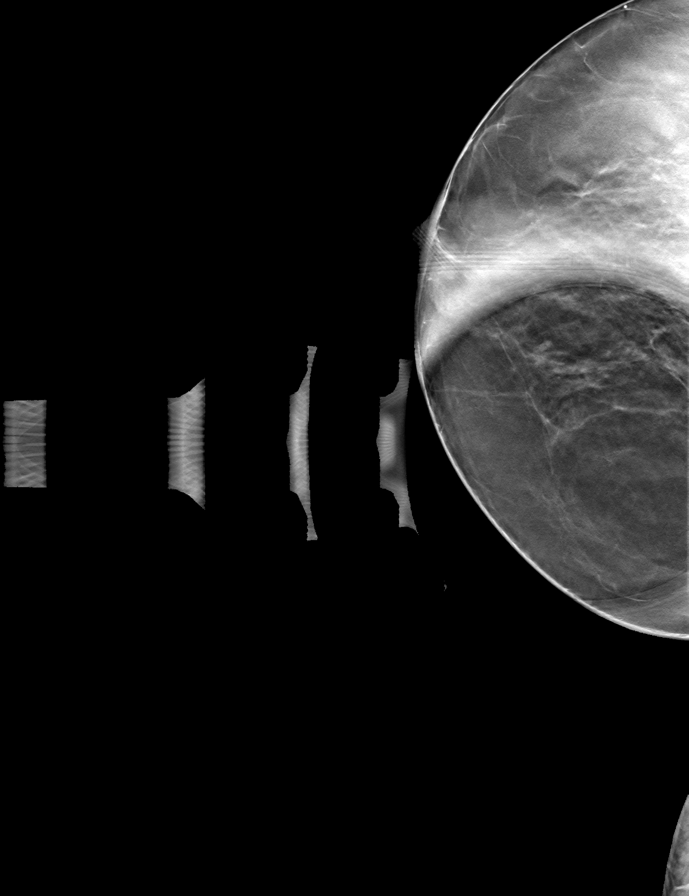

[R ML tomo · tomo slice 35/68.0]
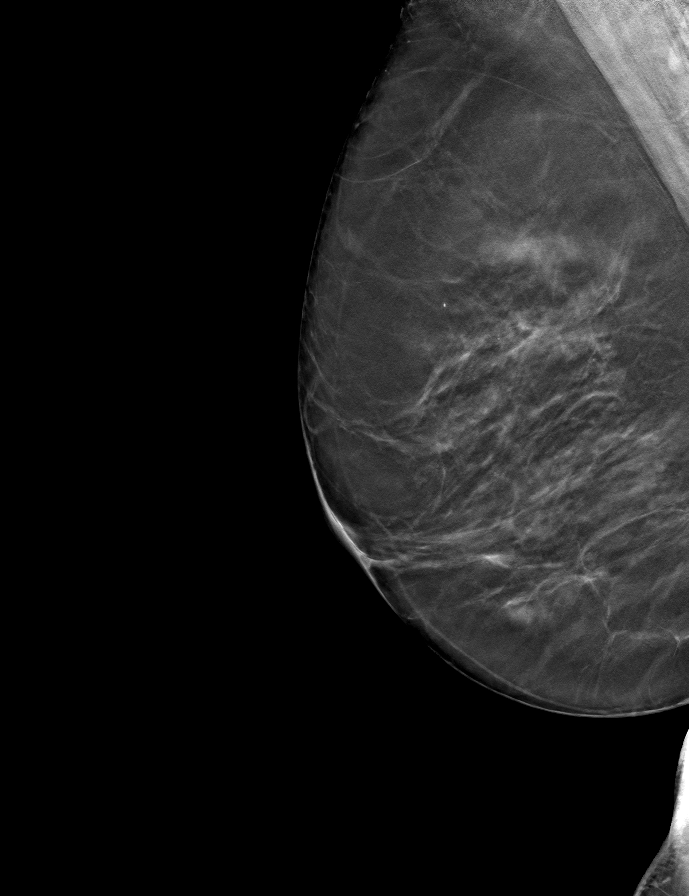

[L MLO tomo · tomo slice 38/75.0]
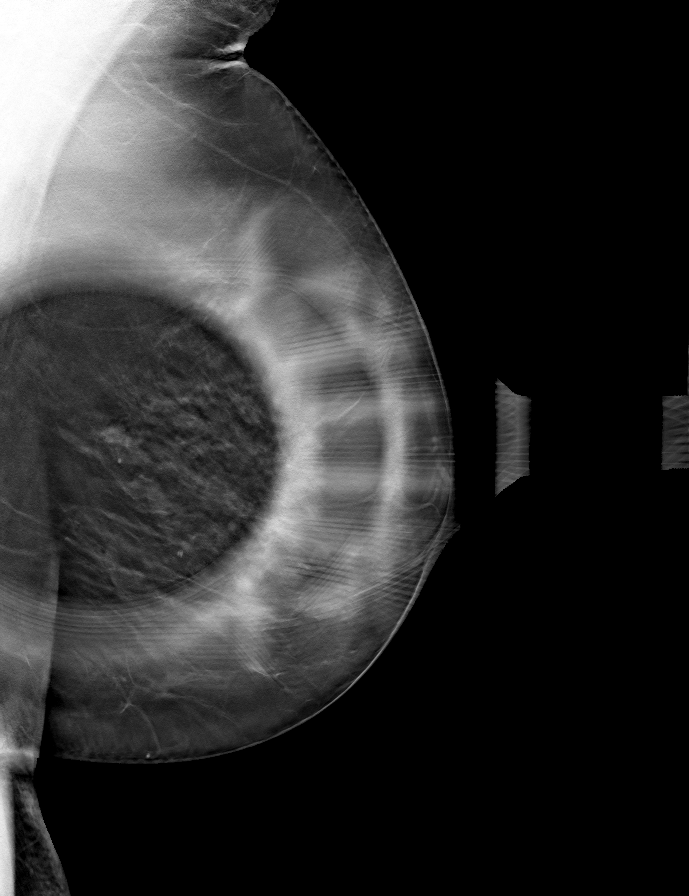

[L CC tomo · tomo slice 35/70.0]
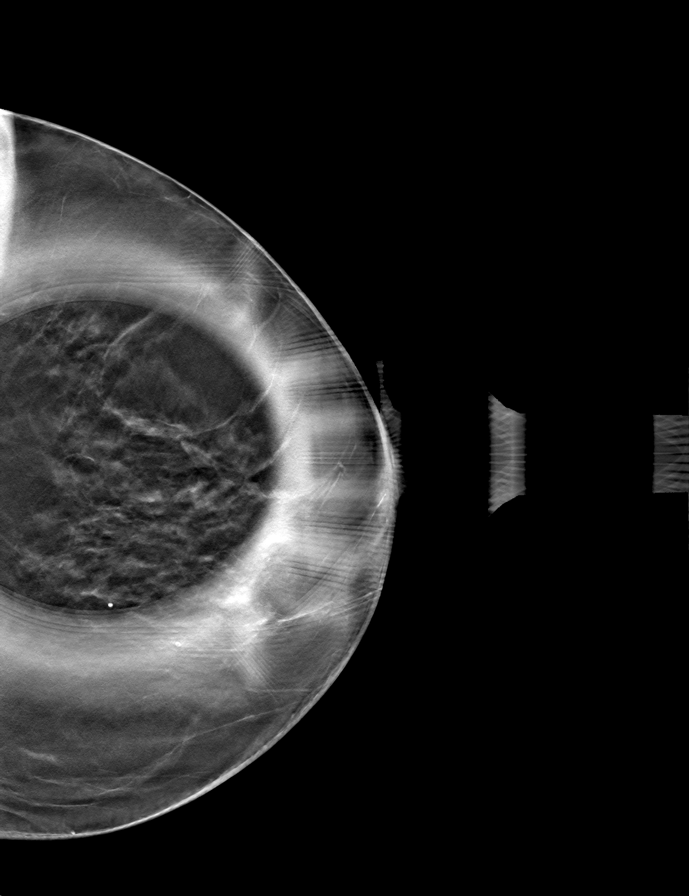

[8 of 24 positions shown; findings below may reference images not displayed]

ACR Breast Density Category b: There are scattered areas of
fibroglandular density.
FINDINGS: Spot compression tomosynthesis images through the medial aspect of
the right breast demonstrates resolution of the asymmetry in
question.

Spot compression tomosynthesis images of the central slightly
inferior left breast demonstrates a persistent oval circumscribed
low-density mass measuring approximately 4 mm. In comparison with
the patient's multiple prior exams, the appearance of the mass on
the additional diagnostic images appears stable.
IMPRESSION: 1. The asymmetry in the right breast resolves with diagnostic
imaging consistent with overlapping fibroglandular tissue.

2. The 4 mm mass in the left breast has been stable for well over 2
years, and is therefore benign.

RECOMMENDATION:
Screening mammogram in one year.(Code:26-7-QGT)

I have discussed the findings and recommendations with the patient.
If applicable, a reminder letter will be sent to the patient
regarding the next appointment.

BI-RADS CATEGORY  2: Benign.

## 2021-12-16 ENCOUNTER — Other Ambulatory Visit: Payer: Self-pay | Admitting: Family Medicine

## 2021-12-28 ENCOUNTER — Ambulatory Visit
Admission: RE | Admit: 2021-12-28 | Discharge: 2021-12-28 | Disposition: A | Discharge status: Home / Self Care | Payer: PRIVATE HEALTH INSURANCE | Source: Ambulatory Visit | Attending: Obstetrics and Gynecology | Admitting: Obstetrics and Gynecology

## 2021-12-28 DIAGNOSIS — Z1231 Encounter for screening mammogram for malignant neoplasm of breast: Secondary | ICD-10-CM

## 2022-02-17 ENCOUNTER — Encounter: Payer: Self-pay | Admitting: *Deleted

## 2022-09-12 ENCOUNTER — Other Ambulatory Visit: Payer: Self-pay | Admitting: Family Medicine

## 2022-11-22 ENCOUNTER — Other Ambulatory Visit: Payer: Self-pay | Admitting: Obstetrics and Gynecology

## 2022-11-22 DIAGNOSIS — Z1231 Encounter for screening mammogram for malignant neoplasm of breast: Secondary | ICD-10-CM

## 2022-12-01 ENCOUNTER — Other Ambulatory Visit: Payer: Self-pay | Admitting: Family Medicine

## 2022-12-06 ENCOUNTER — Encounter: Payer: Self-pay | Admitting: Family Medicine

## 2022-12-06 ENCOUNTER — Ambulatory Visit (INDEPENDENT_AMBULATORY_CARE_PROVIDER_SITE_OTHER): Payer: PRIVATE HEALTH INSURANCE | Admitting: Family Medicine

## 2022-12-06 VITALS — BP 114/74 | HR 69 | Temp 97.3°F | Ht 60.0 in | Wt 139.2 lb

## 2022-12-06 DIAGNOSIS — E785 Hyperlipidemia, unspecified: Secondary | ICD-10-CM

## 2022-12-06 DIAGNOSIS — K219 Gastro-esophageal reflux disease without esophagitis: Secondary | ICD-10-CM | POA: Diagnosis not present

## 2022-12-06 DIAGNOSIS — F325 Major depressive disorder, single episode, in full remission: Secondary | ICD-10-CM

## 2022-12-06 DIAGNOSIS — R7303 Prediabetes: Secondary | ICD-10-CM

## 2022-12-06 DIAGNOSIS — I10 Essential (primary) hypertension: Secondary | ICD-10-CM

## 2022-12-06 LAB — COMPREHENSIVE METABOLIC PANEL
ALT: 31 U/L (ref 0–35)
AST: 23 U/L (ref 0–37)
Albumin: 4.2 g/dL (ref 3.5–5.2)
Alkaline Phosphatase: 81 U/L (ref 39–117)
BUN: 18 mg/dL (ref 6–23)
CO2: 24 meq/L (ref 19–32)
Calcium: 9.7 mg/dL (ref 8.4–10.5)
Chloride: 105 meq/L (ref 96–112)
Creatinine, Ser: 0.76 mg/dL (ref 0.40–1.20)
GFR: 83.51 mL/min (ref >60.00)
Glucose, Bld: 151 mg/dL — ABNORMAL HIGH (ref 70–99)
Potassium: 3.6 meq/L (ref 3.5–5.1)
Sodium: 138 meq/L (ref 135–145)
Total Bilirubin: 0.5 mg/dL (ref 0.2–1.2)
Total Protein: 7.1 g/dL (ref 6.0–8.3)

## 2022-12-06 LAB — TSH: TSH: 1.32 u[IU]/mL (ref 0.35–5.50)

## 2022-12-06 LAB — LIPID PANEL
Cholesterol: 257 mg/dL — ABNORMAL HIGH (ref 0–200)
HDL: 55.2 mg/dL (ref >39.00)
LDL Cholesterol: 162 mg/dL — ABNORMAL HIGH (ref 0–99)
NonHDL: 202.21
Total CHOL/HDL Ratio: 5
Triglycerides: 202 mg/dL — ABNORMAL HIGH (ref 0.0–149.0)
VLDL: 40.4 mg/dL — ABNORMAL HIGH (ref 0.0–40.0)

## 2022-12-06 LAB — CBC
HCT: 43.4 % (ref 36.0–46.0)
Hemoglobin: 14.3 g/dL (ref 12.0–15.0)
MCHC: 32.8 g/dL (ref 30.0–36.0)
MCV: 81 fL (ref 78.0–100.0)
Platelets: 313 10*3/uL (ref 150.0–400.0)
RBC: 5.36 Mil/uL — ABNORMAL HIGH (ref 3.87–5.11)
RDW: 15.9 % — ABNORMAL HIGH (ref 11.5–15.5)
WBC: 4.9 10*3/uL (ref 4.0–10.5)

## 2022-12-06 LAB — HEMOGLOBIN A1C: Hgb A1c MFr Bld: 6.5 % (ref 4.6–6.5)

## 2022-12-06 MED ORDER — ESCITALOPRAM OXALATE 10 MG PO TABS: 10.0000 mg | ORAL_TABLET | Freq: Every day | ORAL | 3 refills | Status: DC

## 2022-12-06 MED ORDER — ESOMEPRAZOLE MAGNESIUM 40 MG PO CPDR: 40.0000 mg | DELAYED_RELEASE_CAPSULE | Freq: Every day | ORAL | 3 refills | Status: DC

## 2022-12-06 MED ORDER — AMLODIPINE BESYLATE 10 MG PO TABS: 10.0000 mg | ORAL_TABLET | Freq: Every day | ORAL | 3 refills | Status: DC

## 2022-12-06 MED ORDER — METOPROLOL SUCCINATE ER 200 MG PO TB24: ORAL_TABLET | ORAL | 3 refills | Status: DC

## 2022-12-06 NOTE — Progress Notes (Signed)
   Debbie Hill is a 62 y.o. female who presents today for an office visit.  Assessment/Plan:  Chronic Problems Addressed Today: GERD (gastroesophageal reflux disease) On Nexium 40 mg daily.  GI is now out of network for her and she request that we take over prescription for Nexium.  She is doing well with this.  No significant side effects.  Will refill today.  Hypertension Blood pressure at goal today on amlodipine 10 mg daily and metoprolol succinate 200 mg daily.  Will refill today.  Dyslipidemia Check lipids.  Discussed lifestyle modifications.  Depression, major, single episode, complete remission (HCC) Mood is stable on Lexapro 10 mg daily.  Will refill today.  Prediabetes Check A1c.  Discussed lifestyle modifications.  Preventative health care Will check labs today.  Vaccines declined.  Up-to-date on colon cancer screening.  No longer needs Pap due to history of hysterectomy.    Subjective:  HPI:  See A/P for status of chronic conditions.  Patient is here today for medication refills.  Last seen here over a year ago.  She needs a refill on her amlodipine, Nexium, and metoprolol. She is doing well on this regimen. No side effects.        Objective:  Physical Exam: BP 114/74   Pulse 69   Temp (!) 97.3 F (36.3 C) (Temporal)   Ht 5' (1.524 m)   Wt 139 lb 3.2 oz (63.1 kg)   SpO2 96%   BMI 27.19 kg/m   Gen: No acute distress, resting comfortably CV: Regular rate and rhythm with no murmurs appreciated Pulm: Normal work of breathing, clear to auscultation bilaterally with no crackles, wheezes, or rhonchi Neuro: Grossly normal, moves all extremities Psych: Normal affect and thought content      Briceson Broadwater M. [name], MD 12/06/2022 9:52 AM

## 2022-12-06 NOTE — Patient Instructions (Signed)
It was very nice to see you today!  We will check blood work today.  I will refill your medications.  Please continue to work on diet and exercise.  Will see you back in a year for your next physical.  Come back sooner if needed.  Return in about 1 year (around 12/06/2023) for Annual Physical.   Take care, Dr [name]  PLEASE NOTE:  If you had any lab tests, please let us know if you have not heard back within a few days. You may see your results on mychart before we have a chance to review them but we will give you a call once they are reviewed by [place].   If we ordered any referrals today, please let us know if you have not heard from their office within the next week.   If you had any urgent prescriptions sent in today, please check with the pharmacy within an hour of our visit to make sure the prescription was transmitted appropriately.   Please try these tips to maintain a healthy lifestyle:  Eat at least 3 REAL meals and 1-2 snacks per day.  Aim for no more than 5 hours between eating.  If you eat breakfast, please do so within one hour of getting up.   Each meal should contain half fruits/vegetables, one quarter protein, and one quarter carbs (no bigger than a computer mouse)  Cut down on sweet beverages. This includes juice, soda, and sweet tea.   Drink at least 1 glass of water with each meal and aim for at least 8 glasses per day  Exercise at least 150 minutes every week.

## 2022-12-06 NOTE — Assessment & Plan Note (Signed)
On Nexium 40 mg daily.  GI is now out of network for her and she request that we take over prescription for Nexium.  She is doing well with this.  No significant side effects.  Will refill today.

## 2022-12-06 NOTE — Assessment & Plan Note (Signed)
Check lipids. Discussed lifestyle modifications.  

## 2022-12-06 NOTE — Assessment & Plan Note (Signed)
Check A1c.  Discussed lifestyle modifications. °

## 2022-12-06 NOTE — Assessment & Plan Note (Signed)
Mood is stable on Lexapro 10 mg daily.  Will refill today.

## 2022-12-06 NOTE — Assessment & Plan Note (Signed)
Blood pressure at goal today on amlodipine 10 mg daily and metoprolol succinate 200 mg daily.  Will refill today.

## 2022-12-09 NOTE — Progress Notes (Signed)
Her a1c is elevated compared to last year and in the diabetic range.  Recommend she schedule appointment soon to discuss treatment options.  We should recheck A1c in 3 months.  Her cholesterol levels are also elevated compared to last time that we checked.  She would benefit from starting a statin medication to improve these numbers and lower risk of heart attack and stroke.  Please send in Lipitor 20 mg daily if she is agreeable to start.  We should recheck in 6 to 12 months.  The rest of her labs are all stable.  We can recheck everything else in a year.

## 2022-12-13 ENCOUNTER — Telehealth: Payer: Self-pay | Admitting: Family Medicine

## 2022-12-13 NOTE — Telephone Encounter (Signed)
Patient returned call. Requests the following RX:  Prescription Request  12/13/2022  LOV: 12/06/2022  What is the name of the medication or equipment? Lipitor 20mg    Have you contacted your pharmacy to request a refill? No   Which pharmacy would you like this sent to?    [facility] - [place], [place] - [address] [place] [address] Phone: [phone] Fax: [phone]    Patient notified that their request is being sent to the clinical staff for review and that they should receive a response within 2 business days.   Please advise at Mobile [phone] (mobile)

## 2022-12-14 ENCOUNTER — Other Ambulatory Visit: Payer: Self-pay | Admitting: *Deleted

## 2022-12-14 MED ORDER — ATORVASTATIN CALCIUM 20 MG PO TABS: 20.0000 mg | ORAL_TABLET | Freq: Every day | ORAL | 3 refills | Status: DC

## 2022-12-14 NOTE — Telephone Encounter (Signed)
Rx send to pharmacy  

## 2023-01-03 ENCOUNTER — Ambulatory Visit
Admission: RE | Admit: 2023-01-03 | Discharge: 2023-01-03 | Disposition: A | Discharge status: Home / Self Care | Payer: PRIVATE HEALTH INSURANCE | Source: Ambulatory Visit | Attending: Obstetrics and Gynecology

## 2023-01-03 DIAGNOSIS — Z1231 Encounter for screening mammogram for malignant neoplasm of breast: Secondary | ICD-10-CM

## 2023-04-24 ENCOUNTER — Other Ambulatory Visit: Payer: Self-pay

## 2023-04-24 ENCOUNTER — Telehealth: Payer: Self-pay

## 2023-04-24 DIAGNOSIS — K219 Gastro-esophageal reflux disease without esophagitis: Secondary | ICD-10-CM

## 2023-04-24 NOTE — Telephone Encounter (Signed)
Copied from CRM [id number]. Topic: Referral - Request for Referral >> Apr 21, 2023 12:52 PM Sonny Dandy B wrote: Did the patient discuss referral with their provider in the last year? Yes (If No - schedule appointment) (If Yes - send message)  Appointment offered? No  Type of order/referral and detailed reason for visit: Acid Reflux   Preference of office, provider, location: gastroenterologists , In [place]   If referral order, have you been seen by this specialty before? No (If Yes, this issue or another issue? When? Where?  Can we respond through MyChart? No   Per last OV note with PCP; referral to GI was mentioned but not in network at the time with patient current insurance; pt now requesting to place referral, referral order was placed for patient

## 2023-05-03 ENCOUNTER — Encounter: Payer: Self-pay | Admitting: Internal Medicine

## 2023-05-03 ENCOUNTER — Ambulatory Visit (INDEPENDENT_AMBULATORY_CARE_PROVIDER_SITE_OTHER): Payer: PRIVATE HEALTH INSURANCE | Admitting: Internal Medicine

## 2023-05-03 ENCOUNTER — Encounter: Payer: Self-pay | Admitting: Family Medicine

## 2023-05-03 VITALS — BP 118/80 | HR 83 | Ht 60.0 in | Wt 140.0 lb

## 2023-05-03 DIAGNOSIS — K219 Gastro-esophageal reflux disease without esophagitis: Secondary | ICD-10-CM

## 2023-05-03 DIAGNOSIS — R053 Chronic cough: Secondary | ICD-10-CM | POA: Diagnosis not present

## 2023-05-03 DIAGNOSIS — R0602 Shortness of breath: Secondary | ICD-10-CM | POA: Diagnosis not present

## 2023-05-03 MED ORDER — ESOMEPRAZOLE MAGNESIUM 40 MG PO CPDR: 40.0000 mg | DELAYED_RELEASE_CAPSULE | Freq: Two times a day (BID) | ORAL | 3 refills | Status: AC

## 2023-05-03 NOTE — Progress Notes (Unsigned)
 Chief Complaint: GERD  HPI : 63 year old female with history of GERD, depression, and headaches presents with GERD  She has had coughing and burping for the last couple of years. She previously followed with [facility] and was tried on several reflux medications. Over the last 6 months, her cough has been chronic. When it is really bad, she feels like she cannot breath. She had a swallow study years ago that showed some acid reflux. She is on Nexium 40 mg every day and Pepcid 20 mg at bedtime. She has tried taking Tums and Maalox on occasion, which does not really help with her cough. Endorses a little chest pain. Denies dysphagia or odynophagia. She has had an EGD and colonoscopy in the past. Last EGD was in 2017 that was normal. Last colonoscopy was also in 2017 that was normal. Denies chest burning. She will occasionally feel regurgitation. She had COVID at the beginning of this year. Denies family history of GI issues. Denies melena, hematochezia, N&V, constipation, or diarrhea. She has gained a few lbs.   Wt Readings from Last 3 Encounters:  05/03/23 140 lb (63.5 kg)  12/06/22 139 lb 3.2 oz (63.1 kg)  10/20/21 137 lb 6.4 oz (62.3 kg)   Past Medical History:  Diagnosis Date   Chronic headaches    Depression    GERD (gastroesophageal reflux disease)    H/O: hysterectomy    Hypertension    Pelvic mass in female 02/2019   Past Surgical History:  Procedure Laterality Date   ABDOMINAL HYSTERECTOMY Bilateral 03/05/2019   Procedure: HYSTERECTOMY ABDOMINAL, BILATERAL SALPINGO-OOPHORECTOMY , AND PELVIC MASS REMOVAL;  Surgeon: Carlisle Cater, MD;  Location: [facility] OR;  Service: Gynecology;  Laterality: Bilateral;   CYSTOSCOPY N/A 03/05/2019   Procedure: CYSTOSCOPY;  Surgeon: Carlisle Cater, MD;  Location: [facility] OR;  Service: Gynecology;  Laterality: N/A;   LASIK Bilateral 2012   Family History  Problem Relation Age of Onset   Cancer Mother        Lung Cancer   Hypertension Mother     Cancer Father        Lung Cancer   Hypertension Sister    Depression Sister    Hypertension Paternal Grandmother    Cancer Paternal Grandfather        Esophagus Cancer   Liver disease Neg Hx    Colon cancer Neg Hx    Esophageal cancer Neg Hx    Social History   Tobacco Use   Smoking status: Never   Smokeless tobacco: Never  Vaping Use   Vaping status: Never Used  Substance Use Topics   Alcohol use: Yes    Comment: occ   Drug use: No   Current Outpatient Medications  Medication Sig Dispense Refill   amLODipine (NORVASC) 10 MG tablet Take 1 tablet (10 mg total) by mouth daily. 90 tablet 3   atorvastatin (LIPITOR) 20 MG tablet Take 1 tablet (20 mg total) by mouth daily. 90 tablet 3   cholecalciferol (VITAMIN D) 1000 units tablet Take 1,000 Units daily by mouth.     escitalopram (LEXAPRO) 10 MG tablet Take 1 tablet (10 mg total) by mouth daily. 90 tablet 3   esomeprazole (NEXIUM) 40 MG capsule Take 1 capsule (40 mg total) by mouth daily. 90 capsule 3   ibuprofen (ADVIL) 800 MG tablet Take 1 tablet (800 mg total) by mouth every 8 (eight) hours. 60 tablet 1   metoprolol (TOPROL-XL) 200 MG 24 hr tablet TAKE 1  TABLET DAILY WITH OR IMMEDIATELY FOLLOWING A MEAL 90 tablet 3   Multiple Vitamins-Minerals (MULTIVITAMIN PO) Take 1 tablet by mouth daily.     No current facility-administered medications for this visit.   Allergies  Allergen Reactions   Sulfa Antibiotics Rash     Review of Systems: All systems reviewed and negative except where noted in HPI.   Physical Exam: BP 118/80   Pulse 83   Ht 5' (1.524 m)   Wt 140 lb (63.5 kg)   BMI 27.34 kg/m  Constitutional: Pleasant,well-developed, female in no acute distress. HEENT: Normocephalic and atraumatic. Conjunctivae are normal. No scleral icterus. Cardiovascular: Normal rate, regular rhythm.  Pulmonary/chest: Effort normal and breath sounds normal. No wheezing, rales or rhonchi. Abdominal: Soft, nondistended, nontender.   Extremities: No edema Neurological: Alert and oriented to person place and time. Skin: Skin is warm and dry. No rashes noted. Psychiatric: Normal mood and affect. Behavior is normal.  Labs 12/2022: CBC unremarkable. CMP with elevated glucose of 151. TSH nml.    ASSESSMENT AND PLAN: Chronic cough SOB GERD Patient presents with chronic cough for years that has worsened over the last 6 months.  She describes some shortness of breath when the coughing gets particularly bothersome.  I will go ahead and increase her PPI therapy to twice daily today to see if this helps with her symptoms.  I also went over conservative strategies to help with acid reflux such as avoiding certain reflux inducing foods.  Her cough may not be entirely related to uncontrolled acid reflux so I encouraged her to reach out to her primary care physician about trying alternative treatments for cough and potentially doing a pulmonary workup to rule out an underlying respiratory condition.  - GERD handout - Start Nexium 40 mg BID. Take 30 min to 1 hr before meals - Could try guaifenesin or dextromethophan to help with cough - Patient will reach out to PCP to rule out alternative sources of cough including a respiratory condition, seasonal allergies, and/or postnasal drip - Next colonoscopy for colon cancer screening is due in 2027 - RTC 2-3 months. If not responding to PPI therapy, then consider EGD in the future  Eulah Pont, MD  I spent 45 minutes of time, including in depth chart review, independent review of results as outlined above, communicating results with the patient directly, face-to-face time with the patient, coordinating care, ordering studies and medications as appropriate, and documentation.

## 2023-05-03 NOTE — Patient Instructions (Addendum)
 Please reach out to your primary care doctor to rule out alternative sources of cough including a respiraotry condition, seasonal allergies, postnasal drip.   We have sent the following medications to your pharmacy for you to pick up at your convenience: Nexium take 30 minutes to an hour before meals    If your blood pressure at your visit was 140/90 or greater, please contact your primary care physician to follow up on this.  _______________________________________________________  If you are age 64 or older, your body mass index should be between 23-30. Your Body mass index is 27.34 kg/m. If this is out of the aforementioned range listed, please consider follow up with your Primary Care Provider.  If you are age 64 or younger, your body mass index should be between 19-25. Your Body mass index is 27.34 kg/m. If this is out of the aformentioned range listed, please consider follow up with your Primary Care Provider.   ________________________________________________________  The [facility] providers would like to encourage you to use [facility] to communicate with providers for non-urgent requests or questions.  Due to long hold times on the telephone, sending your provider a message by [facility] may be a faster and more efficient way to get a response.  Please allow 48 business hours for a response.  Please remember that this is for non-urgent requests.  _______________________________________________________  Due to recent changes in healthcare laws, you may see the results of your imaging and laboratory studies on MyChart before your provider has had a chance to review them.  We understand that in some cases there may be results that are confusing or concerning to you. Not all laboratory results come back in the same time frame and the provider may be waiting for multiple results in order to interpret others.  Please give [place] 48 hours in order for your provider to thoroughly review all the results  before contacting the office for clarification of your results.    Thank you for entrusting me with your care and for choosing [facility], Dr. Eulah Pont

## 2023-05-04 ENCOUNTER — Ambulatory Visit: Payer: PRIVATE HEALTH INSURANCE

## 2023-05-04 ENCOUNTER — Other Ambulatory Visit: Payer: Self-pay | Admitting: *Deleted

## 2023-05-04 DIAGNOSIS — R059 Cough, unspecified: Secondary | ICD-10-CM

## 2023-05-04 NOTE — Telephone Encounter (Signed)
 Ok to order chest xray.  Debbie Hill. [name], MD 05/04/2023 12:17 PM

## 2023-05-04 NOTE — Telephone Encounter (Signed)
 Please see patient message as Lorain Childes; pt scheduled for visit 05/09/23

## 2023-05-09 ENCOUNTER — Telehealth: Payer: Self-pay | Admitting: Family Medicine

## 2023-05-09 ENCOUNTER — Ambulatory Visit (INDEPENDENT_AMBULATORY_CARE_PROVIDER_SITE_OTHER): Payer: PRIVATE HEALTH INSURANCE | Admitting: Family Medicine

## 2023-05-09 ENCOUNTER — Ambulatory Visit: Payer: PRIVATE HEALTH INSURANCE

## 2023-05-09 VITALS — BP 105/70 | HR 72 | Temp 97.7°F | Ht 60.0 in | Wt 139.4 lb

## 2023-05-09 DIAGNOSIS — K219 Gastro-esophageal reflux disease without esophagitis: Secondary | ICD-10-CM

## 2023-05-09 DIAGNOSIS — J309 Allergic rhinitis, unspecified: Secondary | ICD-10-CM

## 2023-05-09 DIAGNOSIS — R059 Cough, unspecified: Secondary | ICD-10-CM | POA: Insufficient documentation

## 2023-05-09 NOTE — Telephone Encounter (Signed)
 Pt wasn't sure whether to cancel her 6 mon fu in April because she came in today. Please advise

## 2023-05-09 NOTE — Telephone Encounter (Signed)
 Recommend she keep appointment.  Katina Degree. [name], MD 05/09/2023 2:01 PM

## 2023-05-09 NOTE — Progress Notes (Signed)
   Debbie Hill is a 63 y.o. female who presents today for an office visit.  Assessment/Plan:  Chronic Problems Addressed Today: Cough Likely multifactorial though this has been going on for over a year at this point.  No other red flags.  She does have quite a bit of allergic rhinitis and postnasal drip which is likely the main contributor.  Also does have some underlying reflux which may be contributing as well.  We are addressing both of these as below.  Also check x-ray today.  If symptoms persist despite treatment for both postnasal drip and reflux and her x-ray is negative will need referral to pulmonology.  We discussed reasons to return to care.  GERD (gastroesophageal reflux disease) On Nexium 40 twice daily per GI.  Allergic rhinitis Not controlled.  This is likely the main contributor to her above persistent cough.  Recommended she try over-the-counter Flonase and allergy medication such as Claritin or Zyrtec.  Hopefully this will help improve her cough.  She will follow-up with [place] in a few weeks.  If no improvement with this would consider referral to allergy or ENT.  We discussed reasons to return to care.     Subjective:  HPI:  See A/P for status of chronic conditions.  Patient here today to discuss cough.  This has been going on for over a year at this point.  We previously had treated her for GERD and postnasal drip for this.  Symptoms do seem to come and go.  Worse recently.  She recently saw her GI physician and they increased her Nexium to twice daily.  She has also tried taking Mucinex which does help some with the cough.  She does feel like she has had worsening congestion recently.  Had COVID a few months ago.  She has recovered from this.  Cough as a barking cough.  Occasionally productive.  No fevers or chills.        Objective:  Physical Exam: BP 105/70   Pulse 72   Temp 97.7 F (36.5 C) (Temporal)   Ht 5' (1.524 m)   Wt 139 lb 6.4 oz (63.2 kg)   SpO2 95%    BMI 27.22 kg/m   Gen: No acute distress, resting comfortably HEENT: OP erythematous.  Nasal mucosa clear bilaterally. CV: Regular rate and rhythm with no murmurs appreciated Pulm: Normal work of breathing, clear to auscultation bilaterally with no crackles, wheezes, or rhonchi Neuro: Grossly normal, moves all extremities Psych: Normal affect and thought content      Graycen Degan M. [name], MD 05/09/2023 1:18 PM

## 2023-05-09 NOTE — Telephone Encounter (Signed)
 Patient aware need to keep OV with PCP

## 2023-05-09 NOTE — Assessment & Plan Note (Signed)
 Not controlled.  This is likely the main contributor to her above persistent cough.  Recommended she try over-the-counter Flonase and allergy medication such as Claritin or Zyrtec.  Hopefully this will help improve her cough.  She will follow-up with [place] in a few weeks.  If no improvement with this would consider referral to allergy or ENT.  We discussed reasons to return to care.

## 2023-05-09 NOTE — Assessment & Plan Note (Signed)
 On Nexium 40 twice daily per GI.

## 2023-05-09 NOTE — Assessment & Plan Note (Addendum)
 Likely multifactorial though this has been going on for over a year at this point.  No other red flags.  She does have quite a bit of allergic rhinitis and postnasal drip which is likely the main contributor.  Also does have some underlying reflux which may be contributing as well.  We are addressing both of these as below.  Also check x-ray today.  If symptoms persist despite treatment for both postnasal drip and reflux and her x-ray is negative will need referral to pulmonology.  We discussed reasons to return to care.

## 2023-05-09 NOTE — Patient Instructions (Addendum)
 It was very nice to see you today!  I think your cough is probably coming from the postnasal drip.  Reflux also may be contributing.  Please continue the Nexium.  Please try Flonase to help with the postnasal drip.  Let me know in a few weeks how you are doing.  If not improving we will refer you to see the pulmonologist.  Return if symptoms worsen or fail to improve.   Take care, Dr [name]  PLEASE NOTE:  If you had any lab tests, please let us know if you have not heard back within a few days. You may see your results on mychart before we have a chance to review them but we will give you a call once they are reviewed by [place].   If we ordered any referrals today, please let us know if you have not heard from their office within the next week.   If you had any urgent prescriptions sent in today, please check with the pharmacy within an hour of our visit to make sure the prescription was transmitted appropriately.   Please try these tips to maintain a healthy lifestyle:  Eat at least 3 REAL meals and 1-2 snacks per day.  Aim for no more than 5 hours between eating.  If you eat breakfast, please do so within one hour of getting up.   Each meal should contain half fruits/vegetables, one quarter protein, and one quarter carbs (no bigger than a computer mouse)  Cut down on sweet beverages. This includes juice, soda, and sweet tea.   Drink at least 1 glass of water with each meal and aim for at least 8 glasses per day  Exercise at least 150 minutes every week.

## 2023-05-09 NOTE — Telephone Encounter (Signed)
**Note De-identified  [place] Obfuscation** Please advise 

## 2023-05-22 NOTE — Telephone Encounter (Signed)
 Results not in yet

## 2023-05-24 NOTE — Telephone Encounter (Signed)
 Patient notified, Xray results not in yet

## 2023-05-25 NOTE — Telephone Encounter (Signed)
 Called Radiology and spoke to [place], asked her to have Chest x-ray read from 3/4. Elnita Maxwell said she will move it to the STAT list. Told her okay, thank you.

## 2023-05-26 ENCOUNTER — Encounter: Payer: Self-pay | Admitting: Family Medicine

## 2023-05-26 NOTE — Telephone Encounter (Signed)
 Dr. [name], results of x-ray are in.

## 2023-05-26 NOTE — Telephone Encounter (Signed)
 See result note.  Debbie Hill. [name], MD 05/26/2023 10:20 AM

## 2023-05-26 NOTE — Progress Notes (Signed)
 Her chest x-ray shows that she has a small amount of congestion in her airways.  Recommend referral to pulmonology if she is still having symptoms.

## 2023-06-13 ENCOUNTER — Ambulatory Visit: Payer: PRIVATE HEALTH INSURANCE | Admitting: Family Medicine

## 2023-08-02 ENCOUNTER — Ambulatory Visit: Payer: PRIVATE HEALTH INSURANCE | Admitting: Internal Medicine

## 2023-10-26 ENCOUNTER — Ambulatory Visit: Payer: Self-pay

## 2023-10-26 NOTE — Telephone Encounter (Signed)
 Patient has an OV on 10/30/2023

## 2023-10-26 NOTE — Telephone Encounter (Signed)
 If pain is severe recommend she be seen ASAP.  Worth HERO. Kennyth, MD 10/26/2023 10:40 AM

## 2023-10-26 NOTE — Telephone Encounter (Signed)
 Spoke with patient advise if symptoms worsen to go to ED or give us  a call to schedule a sooner appt  Patient stated she had a fall a couple of week ago want to do Xrays since she has a shooting pain some time on the shoulder

## 2023-10-26 NOTE — Telephone Encounter (Signed)
 FYI Only or Action Required?: FYI only for provider.  Patient was last seen in primary care on 05/09/2023 by Kennyth Worth HERO, MD.  Called Nurse Triage reporting Fall.  Symptoms began several days ago.  Interventions attempted: Rest, hydration, or home remedies.  Symptoms are: gradually worsening.  Triage Disposition: See PCP When Office is Open (Within 3 Days)  Patient/caregiver understands and will follow disposition?: Yes Copied from CRM #[id number]. Topic: Clinical - Red Word Triage >> Oct 26, 2023  8:36 AM Roselie BROCKS wrote:  [facility] that prompted transfer to Nurse Triage: Patient states she fell a few days ago,and is having severe pain in her shoulder, and thinks maybe needs xray. Reason for Disposition  MILD weakness (e.g., does not interfere with ability to work, go to school, normal activities)  (Exception: Mild weakness is a chronic symptom.)  Answer Assessment - Initial Assessment Questions 1. MECHANISM: How did the fall happen?     Fell in the kitchen  2. DOMESTIC VIOLENCE AND ELDER ABUSE SCREENING: Did you fall because someone pushed you or tried to hurt you? If Yes, ask: Are you safe now?     No  3. ONSET: When did the fall happen? (e.g., minutes, hours, or days ago)     Couple of months ago  4. LOCATION: What part of the body hit the ground? (e.g., back, buttocks, head, hips, knees, hands, head, stomach)     Shoulder, Left  5. INJURY: Did you hurt (injure) yourself when you fell? If Yes, ask: What did you injure? Tell me more about this? (e.g., body area; type of injury; pain severity)     Left Shoulder  6. PAIN: Is there any pain? If Yes, ask: How bad is the pain? (e.g., Scale 0-10; or none, mild,        7. SIZE: For cuts, bruises, or swelling, ask: How large is it? (e.g., inches or centimeters)      No  8. PREGNANCY: Is there any chance you are pregnant? When was your last menstrual period?     No and No  9. OTHER SYMPTOMS: Do you  have any other symptoms? (e.g., dizziness, fever, weakness; new-onset or worsening).     Denies  10. CAUSE: What do you think caused the fall (or falling)? (e.g., dizzy spell, tripped)       Tripped  Protocols used: Falls and [facility]

## 2023-10-30 ENCOUNTER — Ambulatory Visit (INDEPENDENT_AMBULATORY_CARE_PROVIDER_SITE_OTHER): Payer: PRIVATE HEALTH INSURANCE | Admitting: Family Medicine

## 2023-10-30 ENCOUNTER — Ambulatory Visit (INDEPENDENT_AMBULATORY_CARE_PROVIDER_SITE_OTHER): Payer: PRIVATE HEALTH INSURANCE

## 2023-10-30 ENCOUNTER — Encounter: Payer: Self-pay | Admitting: Family Medicine

## 2023-10-30 VITALS — BP 110/80 | HR 67 | Temp 96.8°F | Ht 60.0 in | Wt 138.0 lb

## 2023-10-30 DIAGNOSIS — G4700 Insomnia, unspecified: Secondary | ICD-10-CM | POA: Insufficient documentation

## 2023-10-30 DIAGNOSIS — I10 Essential (primary) hypertension: Secondary | ICD-10-CM | POA: Diagnosis not present

## 2023-10-30 DIAGNOSIS — M25512 Pain in left shoulder: Secondary | ICD-10-CM

## 2023-10-30 DIAGNOSIS — G4730 Sleep apnea, unspecified: Secondary | ICD-10-CM

## 2023-10-30 MED ORDER — MELOXICAM 15 MG PO TABS: 15.0000 mg | ORAL_TABLET | Freq: Every day | ORAL | 0 refills | Status: AC

## 2023-10-30 NOTE — Progress Notes (Signed)
   Debbie Hill is a 63 y.o. female who presents today for an office visit.  Assessment/Plan:  New/Acute Problems: Left Shoulder Pain  Check x-ray today given her fall a couple of months ago.  Concern for potential rotator cuff tendinopathy.  Is reassuring symptoms have improved some over the last couple of weeks.  We did discuss referral to PT or sports medicine however she would like to hold off on that for now.  We discussed home exercise program and handout was given.  Will also start meloxicam  15 mg daily for next couple of weeks.  She will let us  know if not improving next couple of weeks and would consider referral at that time.  Chronic Problems Addressed Today: Insomnia This has been an ongoing issue however husband is worried about sleep apnea due to witnessed apneic episodes.  Will place referral to sleep medicine for further evaluation.  Hypertension Blood pressure at goal today on amlodipine  10 mg daily and metoprolol  succinate 200 mg daily.     Subjective:  HPI:  See assessment / plan for status of chronic conditions.  Discussed the use of AI scribe software for clinical note transcription with the patient, who gave verbal consent to proceed.  History of Present Illness Debbie Hill is a 63 year old female who presents with left shoulder pain following a fall.  She has been experiencing left shoulder pain since a fall a couple of months ago when her 40-pound dog pulled her, causing her to land on her left arm. Initially, both shoulders were affected, but the right shoulder has since improved. The left shoulder pain is described as a 'rubber band' pulling sensation, with sharp pain at times, located at the back and top of the shoulder, and sometimes involving the muscle. The pain varies, with some days being pain-free and others experiencing sharp pain. Over-the-counter medications have been used for relief.  The pain was worse a couple of weeks ago. It is exacerbated when  lowering her arm after raising it, and she experiences discomfort during certain movements, such as rotating her arm. She has not had any imaging or formal treatment for this issue yet.  She also reports sleep disturbances over the past year, characterized by waking up multiple times during the night and feeling the need to urinate upon waking. Her husband has observed that she makes sounds during sleep that suggest she might stop breathing, raising concerns about possible sleep apnea. She has not pursued any treatment for these sleep issues yet.         Objective:  Physical Exam: BP 110/80   Pulse 67   Temp (!) 96.8 F (36 C) (Temporal)   Ht 5' (1.524 m)   Wt 138 lb (62.6 kg)   SpO2 96%   BMI 26.95 kg/m   Gen: No acute distress, resting comfortably CV: Regular rate and rhythm with no murmurs appreciated Pulm: Normal work of breathing, clear to auscultation bilaterally with no crackles, wheezes, or rhonchi MUSCULOSKELETAL:  - Left Arm: No deformities.  Pain elicited with resisted supraspinatus testing.  Pain with internal rotation and external rotation.  Neurovascular intact distally. Neuro: Grossly normal, moves all extremities Psych: Normal affect and thought content      Goldman Birchall M. Kennyth, MD 10/30/2023 12:00 PM

## 2023-10-30 NOTE — Assessment & Plan Note (Signed)
 Blood pressure at goal today on amlodipine  10 mg daily and metoprolol  succinate 200 mg daily.

## 2023-10-30 NOTE — Assessment & Plan Note (Signed)
 This has been an ongoing issue however husband is worried about sleep apnea due to witnessed apneic episodes.  Will place referral to sleep medicine for further evaluation.

## 2023-10-30 NOTE — Patient Instructions (Signed)
 It was very nice to see you today!  VISIT SUMMARY: You visited us  today due to left shoulder pain following a fall and sleep disturbances that may indicate sleep apnea.  YOUR PLAN: LEFT SHOULDER PAIN: You have been experiencing left shoulder pain since a fall a couple of months ago, li -We will start with conservative management. An x-ray of your left shoulder has been ordered -You have been prescribed meloxicam  for two weeks to help with the pain and inflammation. -You will receive a handout with shoulder exercises to perform at home. -We will reassess your condition in two weeks. If there is no improvement, we may refer you to physical therapy or sports medicine.  SLEEP DISTURBANCE, SUSPECTED SLEEP APNEA: You have been experiencing sleep disturbances, and there is a concern that you might have sleep apnea. -We discussed the benefits of diagnosing and treating sleep apnea, including alternatives to CPAP. -You have been referred to a sleep clinic for a sleep study. Depending on your insurance coverage, a home sleep study may be an option.  Return if symptoms worsen or fail to improve.   Take care, Dr Kennyth  PLEASE NOTE:  If you had any lab tests, please let us  know if you have not heard back within a few days. You may see your results on mychart before we have a chance to review them but we will give you a call once they are reviewed by us .   If we ordered any referrals today, please let us  know if you have not heard from their office within the next week.   If you had any urgent prescriptions sent in today, please check with the pharmacy within an hour of our visit to make sure the prescription was transmitted appropriately.   Please try these tips to maintain a healthy lifestyle:  Eat at least 3 REAL meals and 1-2 snacks per day.  Aim for no more than 5 hours between eating.  If you eat breakfast, please do so within one hour of getting up.   Each meal should contain half  fruits/vegetables, one quarter protein, and one quarter carbs (no bigger than a computer mouse)  Cut down on sweet beverages. This includes juice, soda, and sweet tea.   Drink at least 1 glass of water with each meal and aim for at least 8 glasses per day  Exercise at least 150 minutes every week.

## 2023-11-07 ENCOUNTER — Ambulatory Visit: Payer: Self-pay | Admitting: Family Medicine

## 2023-11-07 NOTE — Progress Notes (Signed)
 Her x-ray does not show any fracture.  She should let us  know if pain is not improving and we can refer to PT or sports medicine.

## 2023-11-09 NOTE — Progress Notes (Signed)
 That is fine. Recommend she follow up if symptoms are not improving.

## 2023-11-23 ENCOUNTER — Other Ambulatory Visit: Payer: Self-pay | Admitting: Family Medicine

## 2023-11-24 ENCOUNTER — Other Ambulatory Visit: Payer: Self-pay | Admitting: Family Medicine

## 2023-12-05 ENCOUNTER — Ambulatory Visit (INDEPENDENT_AMBULATORY_CARE_PROVIDER_SITE_OTHER): Payer: PRIVATE HEALTH INSURANCE | Admitting: Family Medicine

## 2023-12-05 ENCOUNTER — Encounter: Payer: Self-pay | Admitting: Family Medicine

## 2023-12-05 VITALS — BP 112/76 | HR 68 | Temp 97.8°F | Ht 60.0 in | Wt 138.8 lb

## 2023-12-05 DIAGNOSIS — G4700 Insomnia, unspecified: Secondary | ICD-10-CM

## 2023-12-05 DIAGNOSIS — R208 Other disturbances of skin sensation: Secondary | ICD-10-CM | POA: Diagnosis not present

## 2023-12-05 DIAGNOSIS — I10 Essential (primary) hypertension: Secondary | ICD-10-CM

## 2023-12-05 MED ORDER — GABAPENTIN 100 MG PO CAPS: 100.0000 mg | ORAL_CAPSULE | Freq: Three times a day (TID) | ORAL | 3 refills | Status: AC

## 2023-12-05 MED ORDER — VALACYCLOVIR HCL 1 G PO TABS: 1000.0000 mg | ORAL_TABLET | Freq: Three times a day (TID) | ORAL | 0 refills | Status: AC

## 2023-12-05 NOTE — Patient Instructions (Addendum)
 It was very nice to see you today!  VISIT SUMMARY: You came in today because of severe back pain and possible shingles. The pain has been improving slightly, and you were able to sleep last night after taking Aleve.  YOUR PLAN: SUSPECTED SHINGLES (HERPES ZOSTER) WITHOUT RASH WITH NEUROPATHIC BACK PAIN: You have been experiencing severe back pain with symptoms that suggest shingles without a rash or possibly a pinched nerve. -Start taking Valtrex for 10 days. This medication is safe to use even if you are around someone who is pregnant. -Start taking gabapentin for nerve pain. Begin with a dose at night, and you have the option to take it during the day if needed. -Your pharmacy has been changed to [facility] on [address]. -Monitor for any new symptoms such as rash, nausea, constipation, or diarrhea and report them if they occur.  Return if symptoms worsen or fail to improve.   Take care, Dr Kennyth  PLEASE NOTE:  If you had any lab tests, please let us  know if you have not heard back within a few days. You may see your results on mychart before we have a chance to review them but we will give you a call once they are reviewed by us .   If we ordered any referrals today, please let us  know if you have not heard from their office within the next week.   If you had any urgent prescriptions sent in today, please check with the pharmacy within an hour of our visit to make sure the prescription was transmitted appropriately.   Please try these tips to maintain a healthy lifestyle:  Eat at least 3 REAL meals and 1-2 snacks per day.  Aim for no more than 5 hours between eating.  If you eat breakfast, please do so within one hour of getting up.   Each meal should contain half fruits/vegetables, one quarter protein, and one quarter carbs (no bigger than a computer mouse)  Cut down on sweet beverages. This includes juice, soda, and sweet tea.   Drink at least 1 glass of water with each meal and aim for at  least 8 glasses per day  Exercise at least 150 minutes every week.

## 2023-12-05 NOTE — Progress Notes (Signed)
 Debbie Hill is a 63 y.o. female who presents today for an office visit.  Assessment/Plan:  New/Acute Problems: Abdominal Allodynia / Paresthesia Patient with 4 days of allodynia and paresthesias on the left lower back rating into left abdomen.  She does not have any current rash or dermatologic findings consistent with shingles however all of her other symptoms are consistent with this.  It is possible that she may have some other form of transient neuropathic pain.  No signs of systemic illness.  No abdominal pain, nausea, vomiting, or diarrhea to suggest GI etiology.  Low suspicion for musculoskeletal etiology.  We will start gabapentin 100 mg nightly though she can increase to 2-3 times daily as needed.  We did discuss empirically starting Valtrex due to degree of suspicion for shingles.  She is agreeable to this plan.  Will start 1000 mg 3 times daily for 10 days.  She will follow-up with us  in a few days via MyChart.  She will let us  know if symptoms do not improve or she develops any other symptoms including rash, fever, nausea, vomiting, diarrhea etc.  Left shoulder pain Improved significantly since we saw her a month ago.  We started her on meloxicam .  She will let us  know if she has any recurrence and we can refer her at that time if needed.  Chronic Problems Addressed Today: Hypertension At goal today on amlodipine  10 mg daily and metoprolol  succinate 200 mg daily.  Insomnia She has been referred to the sleep clinic for evaluation for OSA due to witnessed apneic episodes.  Hopefully will be able to see them soon.     Subjective:  HPI:  See assessment / plan for status of chronic conditions.   Discussed the use of AI scribe software for clinical note transcription with the patient, who gave verbal consent to proceed.  History of Present Illness Debbie Hill is a 63 year old female who presents with back pain and possible shingles.  She has been experiencing severe back  pain since Friday, with the pain being particularly intense on Friday night. The pain is described as pulsating and localized to a specific area in the back, accompanied by a sensation of fever and chills, although she did not have a fever when she checked her temperature. The pain has been improving slightly, and she was able to sleep last night after taking Aleve.  She reports numbness and tingling in the affected area, with her clothes causing discomfort on her skin. No previous similar episodes, digestive symptoms, or rash. She denies any pain radiating down her legs, weakness, or numbness in her spine. No digestive symptoms such as nausea, vomiting, constipation, or diarrhea.  She has been using anti-inflammatory medication for shoulder pain, which has improved significantly.          Objective:  Physical Exam: BP 112/76   Pulse 68   Temp 97.8 F (36.6 C) (Temporal)   Ht 5' (1.524 m)   Wt 138 lb 12.8 oz (63 kg)   SpO2 95%   BMI 27.11 kg/m   Gen: No acute distress, resting comfortably CV: Regular rate and rhythm with no murmurs appreciated Pulm: Normal work of breathing, clear to auscultation bilaterally with no crackles, wheezes, or rhonchi Skin: No rashes or deformities.  Allodynia noted along left lower abdomen, left flank, and left back MUSCULOSKELETAL:  - Back: No deformities.  Nontender to palpation and percussion along spine. Neuro: Grossly normal, moves all extremities Psych: Normal affect and  thought content      Dave Mannes M. Kennyth, MD 12/05/2023 2:49 PM

## 2023-12-05 NOTE — Assessment & Plan Note (Signed)
 At goal today on amlodipine  10 mg daily and metoprolol  succinate 200 mg daily.

## 2023-12-05 NOTE — Assessment & Plan Note (Signed)
 She has been referred to the sleep clinic for evaluation for OSA due to witnessed apneic episodes.  Hopefully will be able to see them soon.

## 2023-12-11 ENCOUNTER — Encounter: Payer: Self-pay | Admitting: Family Medicine

## 2023-12-12 NOTE — Telephone Encounter (Signed)
 FYI

## 2023-12-12 NOTE — Telephone Encounter (Signed)
 I appreciate the update. I am glad that she is feeling better!  She should let us  know if she has any return symptoms.

## 2023-12-20 ENCOUNTER — Other Ambulatory Visit: Payer: Self-pay | Admitting: Obstetrics and Gynecology

## 2023-12-20 DIAGNOSIS — Z1231 Encounter for screening mammogram for malignant neoplasm of breast: Secondary | ICD-10-CM

## 2024-01-04 ENCOUNTER — Ambulatory Visit
Admission: RE | Admit: 2024-01-04 | Discharge: 2024-01-04 | Disposition: A | Payer: PRIVATE HEALTH INSURANCE | Source: Ambulatory Visit | Attending: Obstetrics and Gynecology

## 2024-01-04 DIAGNOSIS — Z1231 Encounter for screening mammogram for malignant neoplasm of breast: Secondary | ICD-10-CM

## 2024-01-09 ENCOUNTER — Other Ambulatory Visit: Payer: Self-pay | Admitting: Obstetrics and Gynecology

## 2024-01-09 DIAGNOSIS — R928 Other abnormal and inconclusive findings on diagnostic imaging of breast: Secondary | ICD-10-CM

## 2024-01-17 ENCOUNTER — Ambulatory Visit
Admission: RE | Admit: 2024-01-17 | Discharge: 2024-01-17 | Disposition: A | Discharge status: Home / Self Care | Payer: PRIVATE HEALTH INSURANCE | Source: Ambulatory Visit | Attending: Obstetrics and Gynecology | Admitting: Obstetrics and Gynecology

## 2024-01-17 ENCOUNTER — Other Ambulatory Visit: Payer: Self-pay | Admitting: Obstetrics and Gynecology

## 2024-01-17 DIAGNOSIS — R928 Other abnormal and inconclusive findings on diagnostic imaging of breast: Secondary | ICD-10-CM

## 2024-01-17 DIAGNOSIS — N6311 Unspecified lump in the right breast, upper outer quadrant: Secondary | ICD-10-CM

## 2024-01-23 ENCOUNTER — Ambulatory Visit
Admission: RE | Admit: 2024-01-23 | Discharge: 2024-01-23 | Disposition: A | Discharge status: Home / Self Care | Source: Ambulatory Visit | Attending: Obstetrics and Gynecology | Admitting: Obstetrics and Gynecology

## 2024-01-23 ENCOUNTER — Ambulatory Visit
Admission: RE | Admit: 2024-01-23 | Discharge: 2024-01-23 | Disposition: A | Discharge status: Home / Self Care | Payer: PRIVATE HEALTH INSURANCE | Source: Ambulatory Visit | Attending: Obstetrics and Gynecology | Admitting: Obstetrics and Gynecology

## 2024-01-23 DIAGNOSIS — N6311 Unspecified lump in the right breast, upper outer quadrant: Secondary | ICD-10-CM

## 2024-01-23 HISTORY — PX: BREAST BIOPSY: SHX20

## 2024-01-24 LAB — SURGICAL PATHOLOGY

## 2024-02-19 ENCOUNTER — Other Ambulatory Visit: Payer: Self-pay | Admitting: Surgery
